# Patient Record
Sex: Male | Born: 1980 | Race: White | Hispanic: No | Marital: Married | Smoking: Never smoker
Health system: Southern US, Community
[De-identification: ages and names within clinical notes are randomized; demographics above are authoritative.]

## PROBLEM LIST (undated history)

## (undated) DIAGNOSIS — F419 Anxiety disorder, unspecified: Secondary | ICD-10-CM

## (undated) DIAGNOSIS — F329 Major depressive disorder, single episode, unspecified: Secondary | ICD-10-CM

## (undated) DIAGNOSIS — F32A Depression, unspecified: Secondary | ICD-10-CM

## (undated) HISTORY — DX: Major depressive disorder, single episode, unspecified: F32.9

## (undated) HISTORY — DX: Depression, unspecified: F32.A

## (undated) HISTORY — DX: Anxiety disorder, unspecified: F41.9

## (undated) HISTORY — PX: WISDOM TOOTH EXTRACTION: SHX21

---

## 2014-08-09 ENCOUNTER — Ambulatory Visit (INDEPENDENT_AMBULATORY_CARE_PROVIDER_SITE_OTHER): Payer: BC Managed Care – PPO | Admitting: Family Medicine

## 2014-08-09 VITALS — BP 104/70 | HR 95 | Temp 99.3°F | Resp 18 | Ht 73.0 in | Wt 217.8 lb

## 2014-08-09 DIAGNOSIS — A084 Viral intestinal infection, unspecified: Secondary | ICD-10-CM | POA: Diagnosis not present

## 2014-08-09 DIAGNOSIS — R519 Headache, unspecified: Secondary | ICD-10-CM

## 2014-08-09 DIAGNOSIS — R110 Nausea: Secondary | ICD-10-CM

## 2014-08-09 DIAGNOSIS — E860 Dehydration: Secondary | ICD-10-CM

## 2014-08-09 DIAGNOSIS — R197 Diarrhea, unspecified: Secondary | ICD-10-CM

## 2014-08-09 DIAGNOSIS — R51 Headache: Secondary | ICD-10-CM

## 2014-08-09 LAB — POCT CBC
Granulocyte percent: 80.5 %G — AB (ref 37–80)
HCT, POC: 46.6 % (ref 43.5–53.7)
Hemoglobin: 15.6 g/dL (ref 14.1–18.1)
Lymph, poc: 0.8 (ref 0.6–3.4)
MCH, POC: 28.1 pg (ref 27–31.2)
MCHC: 33.4 g/dL (ref 31.8–35.4)
MCV: 84.1 fL (ref 80–97)
MID (cbc): 0.7 (ref 0–0.9)
MPV: 7.6 fL (ref 0–99.8)
POC Granulocyte: 6.3 (ref 2–6.9)
POC LYMPH PERCENT: 10 %L (ref 10–50)
POC MID %: 9.5 % (ref 0–12)
Platelet Count, POC: 280 10*3/uL (ref 142–424)
RBC: 5.54 M/uL (ref 4.69–6.13)
RDW, POC: 13.4 %
WBC: 7.8 10*3/uL (ref 4.6–10.2)

## 2014-08-09 LAB — GLUCOSE, POCT (MANUAL RESULT ENTRY): POC Glucose: 103 mg/dL — AB (ref 70–99)

## 2014-08-09 LAB — POCT INFLUENZA A/B
Influenza A, POC: NEGATIVE
Influenza B, POC: NEGATIVE

## 2014-08-09 MED ORDER — ONDANSETRON 4 MG PO TBDP: 4.0000 mg | ORAL_TABLET | Freq: Three times a day (TID) | ORAL | Status: DC | PRN

## 2014-08-09 MED ORDER — KETOROLAC TROMETHAMINE 60 MG/2ML IM SOLN
60.0000 mg | Freq: Once | INTRAMUSCULAR | Status: AC
  Administered 2014-08-09: 60 mg via INTRAMUSCULAR

## 2014-08-09 MED ORDER — ONDANSETRON 4 MG PO TBDP
4.0000 mg | ORAL_TABLET | Freq: Once | ORAL | Status: AC
  Administered 2014-08-09: 4 mg via ORAL

## 2014-08-09 NOTE — Progress Notes (Signed)
Chief Complaint:  Chief Complaint  Patient presents with  . Nausea    x24 hours  . Headache  . Dizziness  . Generalized Body Aches    HPI: Carl Price is a 34 y.o. male who is here for 24 hours of nausea, headaches, dizziness, headache, primarily around temporal lobe and frotal lobe, dizziness, generalized body aches, diarrhea, nonbloody. Denies abd pain.  No sick contacts with similar sxs at home or at work. He has had diarrhea, nonbloody x 2 episodes. When he eats everything goes through. Fevers and chills. Yesterday he had [employer]. He has eaten there before without problems. He had food at home. He had some polenta and artichokes. Egg, no undercooked meats or dairy that he is aware of . He has city water. No one at his house has it. DHe has no neck pain. Shivers in the chest. No abd pain. Has not brough up anything. He has has hsoem SOb but no CO. No recent travels.   He takes Zoloft regularly and has had no side effects from the medication. He has had no increase in Zoloft.   Past Medical History  Diagnosis Date  . Anxiety   . Depression    History reviewed. No pertinent past surgical history. History   Social History  . Marital Status: Single    Spouse Name: N/A  . Number of Children: N/A  . Years of Education: N/A   Social History Main Topics  . Smoking status: Never Smoker   . Smokeless tobacco: Not on file  . Alcohol Use: 0.0 oz/week    0 Standard drinks or equivalent per week  . Drug Use: No  . Sexual Activity: Not on file   Other Topics Concern  . None   Social History Narrative  . None   History reviewed. No pertinent family history. No Known Allergies Prior to Admission medications   Medication Sig Start Date End Date Taking? Authorizing Provider  sertraline (ZOLOFT) 100 MG tablet Take 150 mg by mouth daily.   Yes Historical Provider, MD     ROS: The patient denies fevers, night sweats, unintentional weight loss, chest pain,  palpitations, wheezing, dyspnea on exertion, nausea, vomiting, abdominal pain, dysuria, hematuria, melena, numbness, weakness, or tingling.   All other systems have been reviewed and were otherwise negative with the exception of those mentioned in the HPI and as above.    PHYSICAL EXAM: Filed Vitals:   08/09/14 1658  BP: 110/82  Pulse: 105  Temp: 99.3 F (37.4 C)  Resp: 18   Filed Vitals:   08/09/14 1658  Height: 6\' 1"  (1.854 m)  Weight: 217 lb 12.8 oz (98.793 kg)   Body mass index is 28.74 kg/(m^2).  General: Alert, no acute distress, tired appearing.  HEENT:  Normocephalic, atraumatic, oropharynx patent. EOMI, PERRLA, TM normal.  Dry oral mucosa Cardiovascular:  Regular rate and rhythm, no rubs murmurs or gallops.  No Carotid bruits, radial pulse intact. No pedal edema.  Respiratory: Clear to auscultation bilaterally.  No wheezes, rales, or rhonchi.  No cyanosis, no use of accessory musculature GI: No organomegaly, abdomen is soft and non-tender, positive bowel sounds.  No masses. Skin: No rashes. Neurologic: Facial musculature symmetric. Psychiatric: Patient is appropriate throughout our interaction. Lymphatic: No cervical lymphadenopathy Musculoskeletal: Gait intact. No myoclonus. 5/5 strength , sensation intact   LABS: Results for orders placed or performed in visit on 08/09/14  POCT CBC  Result Value Ref Range   WBC  7.8 4.6 - 10.2 K/uL   Lymph, poc 0.8 0.6 - 3.4   POC LYMPH PERCENT 10.0 10 - 50 %L   MID (cbc) 0.7 0 - 0.9   POC MID % 9.5 0 - 12 %M   POC Granulocyte 6.3 2 - 6.9   Granulocyte percent 80.5 (A) 37 - 80 %G   RBC 5.54 4.69 - 6.13 M/uL   Hemoglobin 15.6 14.1 - 18.1 g/dL   HCT, POC 40.946.6 81.143.5 - 53.7 %   MCV 84.1 80 - 97 fL   MCH, POC 28.1 27 - 31.2 pg   MCHC 33.4 31.8 - 35.4 g/dL   RDW, POC 91.413.4 %   Platelet Count, POC 280 142 - 424 K/uL   MPV 7.6 0 - 99.8 fL  POCT glucose (manual entry)  Result Value Ref Range   POC Glucose 103 (A) 70 - 99 mg/dl    POCT Influenza A/B  Result Value Ref Range   Influenza A, POC Negative    Influenza B, POC Negative      EKG/XRAY:   Primary [facility]   ASSESSMENT/PLAN: Encounter Diagnoses  Name Primary?  . Viral gastroenteritis Yes  . Dehydration   . Nausea without vomiting   . Diarrhea    IV fluids given 2 L, he felt better.  Zofran ODT Bland diet advance as tolerated Returns stool samples for lab when able Labs pending  Gross sideeffects, risk and benefits, and alternatives of medications d/w patient. Patient is aware that all medications have potential sideeffects and we are unable to predict every sideeffect or drug-drug interaction that may occur.  Hamilton CapriLE, Luxe Cuadros PHUONG, DO 08/09/2014 6:28 PM    08/11/14-he is doing much better. He has no symptoms currently. I discussed his labs with him. His potassium was high but his red blood cells were hemolyzed and so I told him about returning for repeat blood work but he was okay with just monitoring everything. Given precautions to return ASAP

## 2014-08-09 NOTE — Patient Instructions (Signed)

## 2014-08-10 LAB — COMPLETE METABOLIC PANEL WITH GFR
ALT: 27 U/L (ref 0–53)
BUN: 13 mg/dL (ref 6–23)
CO2: 20 mEq/L (ref 19–32)
Calcium: 8.7 mg/dL (ref 8.4–10.5)
Creat: 0.96 mg/dL (ref 0.50–1.35)
GFR, Est African American: >89 mL/min
GFR, Est Non African American: >89 mL/min
Glucose, Bld: 99 mg/dL (ref 70–99)
Total Bilirubin: 0.7 mg/dL (ref 0.2–1.2)

## 2014-08-10 LAB — COMPLETE METABOLIC PANEL WITHOUT GFR
AST: 36 U/L (ref 0–37)
Albumin: 4 g/dL (ref 3.5–5.2)
Alkaline Phosphatase: 65 U/L (ref 39–117)
Chloride: 101 meq/L (ref 96–112)
Potassium: 6.1 meq/L — ABNORMAL HIGH (ref 3.5–5.3)
Sodium: 135 meq/L (ref 135–145)
Total Protein: 6.8 g/dL (ref 6.0–8.3)

## 2014-08-11 LAB — OVA AND PARASITE EXAMINATION: OP: NONE SEEN

## 2014-08-14 LAB — STOOL CULTURE

## 2014-10-28 ENCOUNTER — Ambulatory Visit (INDEPENDENT_AMBULATORY_CARE_PROVIDER_SITE_OTHER): Payer: BC Managed Care – PPO | Admitting: Physician Assistant

## 2014-10-28 VITALS — BP 122/84 | HR 96 | Temp 98.5°F | Resp 16 | Ht 73.0 in | Wt 218.0 lb

## 2014-10-28 DIAGNOSIS — J020 Streptococcal pharyngitis: Secondary | ICD-10-CM | POA: Diagnosis not present

## 2014-10-28 DIAGNOSIS — F418 Other specified anxiety disorders: Secondary | ICD-10-CM | POA: Diagnosis not present

## 2014-10-28 DIAGNOSIS — J029 Acute pharyngitis, unspecified: Secondary | ICD-10-CM | POA: Diagnosis not present

## 2014-10-28 DIAGNOSIS — F419 Anxiety disorder, unspecified: Secondary | ICD-10-CM

## 2014-10-28 DIAGNOSIS — F329 Major depressive disorder, single episode, unspecified: Secondary | ICD-10-CM

## 2014-10-28 DIAGNOSIS — F32A Depression, unspecified: Secondary | ICD-10-CM

## 2014-10-28 LAB — POCT RAPID STREP A (OFFICE): RAPID STREP A SCREEN: POSITIVE — AB

## 2014-10-28 MED ORDER — SERTRALINE HCL 100 MG PO TABS: 150.0000 mg | ORAL_TABLET | Freq: Every day | ORAL | Status: DC

## 2014-10-28 MED ORDER — AMOXICILLIN 875 MG PO TABS: 875.0000 mg | ORAL_TABLET | Freq: Two times a day (BID) | ORAL | Status: AC

## 2014-10-28 NOTE — Patient Instructions (Addendum)
Take antibiotic twice a day for 10 days. If you are not feeling much better in 48-72 hours, call. Make appointment for a physical with your primary doctor. 3 months of zoloft sent to pharmacy.

## 2014-10-28 NOTE — Progress Notes (Signed)
[facility] [address], [place] [address] [id number]  Date:  10/28/2014   Name:  Carl Price   DOB:  1980-08-21   MRN:  [id number]  PCP:  No primary care provider on file.    Chief Complaint: Sore Throat and sinus drainage   History of Present Illness:  This is a 34 y.o. male with PMH depression and anxiety who is presenting with 2 days of sore throat.  Cough: mild SOB/wheezing: no Nasal congestion: Feels like mucus is draining down back of throat but can't get any up. Otalgia: some yesterday but none today Sore throat: Worse at night and in the morning and subsides during the day. Fever/chills: having chills and aches but no fevers. Fiance with similar illness but has resolved. Today toddler started having vomiting.  Aggravating/alleviating factors: taking mucinex, tylenol and chloraseptic spray - some help. History of asthma: no History of env allergies: no Tobacco use: no  Pt has been taking zoloft 150 mg for the past 5 years. This is prescribed to him by his PCP who he sees once a year. He is about to run out of his medication. He called his PCPs office yesterday but no call back yet. He is due for a yearly physical with him. Wondering if he can get a short supply of his zoloft until his appt.  Review of Systems:  Review of Systems See HPI  There are no active problems to display for this patient.   Prior to Admission medications   Medication Sig Start Date End Date Taking? Authorizing Provider  sertraline (ZOLOFT) 100 MG tablet Take 150 mg by mouth daily.   Yes Historical Provider, MD    No Known Allergies  History reviewed. No pertinent past surgical history.  History  Substance Use Topics  . Smoking status: Never Smoker   . Smokeless tobacco: Not on file  . Alcohol Use: 0.0 oz/week    0 Standard drinks or equivalent per week    History reviewed. No pertinent family history.  Medication list has been reviewed and  updated.  Physical Examination:  Physical Exam  Constitutional: He is oriented to person, place, and time. He appears well-developed and well-nourished. No distress.  HENT:  Head: Normocephalic and atraumatic.  Right Ear: Hearing, tympanic membrane, external ear and ear canal normal.  Left Ear: Hearing, tympanic membrane, external ear and ear canal normal.  Nose: Nose normal. Right sinus exhibits no maxillary sinus tenderness and no frontal sinus tenderness. Left sinus exhibits no maxillary sinus tenderness and no frontal sinus tenderness.  Mouth/Throat: Uvula is midline and mucous membranes are normal. Posterior oropharyngeal erythema (beefy red) present. No oropharyngeal exudate or posterior oropharyngeal edema.  Eyes: Conjunctivae and lids are normal. Right eye exhibits no discharge. Left eye exhibits no discharge. No scleral icterus.  Cardiovascular: Normal rate, regular rhythm, normal heart sounds and normal pulses.   No murmur heard. Pulmonary/Chest: Effort normal and breath sounds normal. No respiratory distress. He has no wheezes. He has no rhonchi. He has no rales.  Musculoskeletal: Normal range of motion.  Lymphadenopathy:       Head (right side): No submental, no submandibular and no tonsillar adenopathy present.       Head (left side): No submental, no submandibular and no tonsillar adenopathy present.    He has no cervical adenopathy.  Neurological: He is alert and oriented to person, place, and time.  Skin: Skin is warm, dry and intact. No lesion and no  rash noted.  Psychiatric: He has a normal mood and affect. His speech is normal and behavior is normal. Thought content normal.   BP 122/84 mmHg  Pulse 96  Temp(Src) 98.5 F (36.9 C) (Oral)  Resp 16  Ht 6\' 1"  (1.854 m)  Wt 218 lb (98.884 kg)  BMI 28.77 kg/m2  SpO2 98%  Results for orders placed or performed in visit on 10/28/14  POCT rapid strep A  Result Value Ref Range   Rapid Strep A Screen Positive (A) Negative    Assessment and Plan:  1. Streptococcal sore throat 2. Sore throat Rapid strep positive. Amoxicillin prescribed. Call if not significantly improved in 48-72 hours. - amoxicillin (AMOXIL) 875 MG tablet; Take 1 tablet (875 mg total) by mouth 2 (two) times daily.  Dispense: 20 tablet; Refill: 0 - POCT rapid strep A  3. Anxiety and depression zoloft refilled x 3 months until his follow up with PCP. - sertraline (ZOLOFT) 100 MG tablet; Take 1.5 tablets (150 mg total) by mouth daily.  Dispense: 30 tablet; Refill: 2   [place] [employer]. Dyke Brackett, MHS [facility]  10/28/2014

## 2015-02-14 ENCOUNTER — Other Ambulatory Visit: Payer: Self-pay | Admitting: Physician Assistant

## 2015-02-19 ENCOUNTER — Encounter: Payer: Self-pay | Admitting: Internal Medicine

## 2015-02-19 ENCOUNTER — Ambulatory Visit (INDEPENDENT_AMBULATORY_CARE_PROVIDER_SITE_OTHER): Payer: BC Managed Care – PPO | Admitting: Internal Medicine

## 2015-02-19 VITALS — BP 118/72 | HR 73 | Temp 97.9°F | Resp 16 | Wt 224.0 lb

## 2015-02-19 DIAGNOSIS — H9190 Unspecified hearing loss, unspecified ear: Secondary | ICD-10-CM | POA: Diagnosis not present

## 2015-02-19 DIAGNOSIS — F419 Anxiety disorder, unspecified: Secondary | ICD-10-CM | POA: Diagnosis not present

## 2015-02-19 DIAGNOSIS — Z23 Encounter for immunization: Secondary | ICD-10-CM

## 2015-02-19 DIAGNOSIS — F329 Major depressive disorder, single episode, unspecified: Secondary | ICD-10-CM | POA: Insufficient documentation

## 2015-02-19 DIAGNOSIS — Z0000 Encounter for general adult medical examination without abnormal findings: Secondary | ICD-10-CM | POA: Diagnosis not present

## 2015-02-19 DIAGNOSIS — F32A Depression, unspecified: Secondary | ICD-10-CM | POA: Insufficient documentation

## 2015-02-19 MED ORDER — SERTRALINE HCL 100 MG PO TABS: 150.0000 mg | ORAL_TABLET | Freq: Every day | ORAL | Status: DC

## 2015-02-19 NOTE — Progress Notes (Signed)
Pre visit review using our clinic review tool, if applicable. No additional management support is needed unless otherwise documented below in the visit note. 

## 2015-02-19 NOTE — Patient Instructions (Addendum)
  We have reviewed your prior records including labs and tests today.  Test(s) ordered today. Your results will be released to MyChart (or called to you) after review, usually within 72hours after test completion. If any changes need to be made, you will be notified at that same time.  All other Health Maintenance issues reviewed.   All recommended immunizations and age-appropriate screenings are up-to-date.  Flu vaccine administered today.   Medications reviewed and updated.  No changes recommended at this time.  Your prescription(s) have been submitted to your pharmacy. Please take as directed and contact our office if you believe you are having problem(s) with the medication(s).   Please schedule followup in 6 months      

## 2015-02-19 NOTE — Progress Notes (Signed)
Subjective:    Patient ID: Carl Price, male    DOB: 05-24-1980, 34 y.o.   MRN: [id number]  HPI He is here to establish with a new pcp.  He is here for a physical exam. His only concern is getting another prescription for his sertraline.  Anxiety:  He has been on zoloft for years.  He feels it does affect his libido and has caused some weight gain.  He has never been on any other medication. He finds the medication very helpful and is happy with his current dose. He denies any history or issues with depression.  He thinks his hearing is worse.  He does have a family history of hearing loss.   Medications and allergies reviewed with patient and updated if appropriate.  Patient Active Problem List   Diagnosis Date Noted  . Anxiety 02/19/2015    No current outpatient prescriptions on file prior to visit.   No current facility-administered medications on file prior to visit.    Past Medical History  Diagnosis Date  . Anxiety   . Depression     Past Surgical History  Procedure Laterality Date  . Wisdom tooth extraction      Social History   Social History  . Marital Status: Single    Spouse Name: N/A  . Number of Children: N/A  . Years of Education: N/A   Social History Main Topics  . Smoking status: Never Smoker   . Smokeless tobacco: Never Used  . Alcohol Use: 0.0 oz/week    0 Standard drinks or equivalent per week  . Drug Use: No  . Sexual Activity: Not Asked   Other Topics Concern  . None   Social History Narrative   Engaged      Exercise: walking irregularly          Review of Systems  Constitutional: Negative for fever, chills, appetite change and unexpected weight change.       Energy level on lower side  HENT: Positive for hearing loss. Negative for congestion, sinus pressure and sore throat.   Eyes: Negative for visual disturbance.  Respiratory: Negative for cough, shortness of breath and wheezing.   Cardiovascular: Negative for chest  pain, palpitations and leg swelling.  Gastrointestinal: Positive for constipation (mild). Negative for nausea, abdominal pain, diarrhea and blood in stool.       No GERD  Genitourinary: Negative for dysuria and hematuria.  Musculoskeletal: Positive for arthralgias (pain in  shoulder area - intermittent). Negative for myalgias and back pain.  Neurological: Negative for dizziness, weakness, light-headedness, numbness and headaches.  Psychiatric/Behavioral: Negative for decreased concentration.       Objective:   Filed Vitals:   02/19/15 1313  BP: 118/72  Pulse: 73  Temp: 97.9 F (36.6 C)  Resp: 16   Filed Weights   02/19/15 1313  Weight: 224 lb (101.606 kg)   Body mass index is 29.56 kg/(m^2).   Physical Exam  Constitutional: He appears well-developed and well-nourished. No distress.  HENT:  Head: Normocephalic and atraumatic.  Right Ear: External ear normal.  Left Ear: External ear normal.  Excessive cerumen bilateral ear canals, unable to visualize tympanic membranes  Eyes: Conjunctivae are normal.  Neck: Neck supple. No tracheal deviation present. No thyromegaly present.  Cardiovascular: Normal rate, regular rhythm, normal heart sounds and intact distal pulses.   No murmur heard. Pulmonary/Chest: Effort normal and breath sounds normal. No respiratory distress. He has no wheezes. He has no rales.  Abdominal:  Soft. He exhibits no distension. There is no tenderness.  Musculoskeletal: He exhibits no edema.  Lymphadenopathy:    He has no cervical adenopathy.  Skin: Skin is warm and dry.  Psychiatric: He has a normal mood and affect. His behavior is normal.          Assessment & Plan:   Physical exam: Screening blood work ordered He believes he has had a tetanus in the last 10 years Flu shot. Stressed regular exercise He plans on working on weight loss Discussed healthy diet Discussed appropriate alcohol intake No concern for substance abuse He denies skin  concerns-recommended checking his for changes Discussed importance of self scrotal exams  See problem list for further assessment and plan  Follow-up in 6 months, sooner if needed

## 2015-02-19 NOTE — Assessment & Plan Note (Signed)
Continue sertraline 150 mg daily Discussed switching to a different medication given his side effects from medication, but he prefers to stay on same medication. He will call me or return if he changes his mind Follow-up in 6 months, sooner if needed

## 2015-08-13 ENCOUNTER — Telehealth: Payer: Self-pay

## 2015-08-13 DIAGNOSIS — F32A Depression, unspecified: Secondary | ICD-10-CM

## 2015-08-13 DIAGNOSIS — F329 Major depressive disorder, single episode, unspecified: Secondary | ICD-10-CM

## 2015-08-13 DIAGNOSIS — F419 Anxiety disorder, unspecified: Principal | ICD-10-CM

## 2015-08-13 MED ORDER — SERTRALINE HCL 100 MG PO TABS: 150.0000 mg | ORAL_TABLET | Freq: Every day | ORAL | Status: DC

## 2015-08-13 NOTE — Telephone Encounter (Signed)
Pt called and rq rf for sertraline. 6 month follow up was needed according to the 01/2015 OV.   Follow up appt has now been scheduled. Erx for sertraline has been sent.

## 2015-08-15 ENCOUNTER — Other Ambulatory Visit: Payer: Self-pay | Admitting: Internal Medicine

## 2015-08-15 NOTE — Telephone Encounter (Signed)
Pt is requesting 90 day supply. Okay to fill?

## 2015-08-31 ENCOUNTER — Encounter: Payer: BC Managed Care – PPO | Admitting: Internal Medicine

## 2015-08-31 DIAGNOSIS — Z0289 Encounter for other administrative examinations: Secondary | ICD-10-CM

## 2015-08-31 NOTE — Patient Instructions (Signed)
  Test(s) ordered today. Your results will be released to MyChart (or called to you) after review, usually within 72hours after test completion. If any changes need to be made, you will be notified at that same time.  All other Health Maintenance issues reviewed.   All recommended immunizations and age-appropriate screenings are up-to-date or discussed.  No immunizations administered today.   Medications reviewed and updated.  Changes include  /  No changes recommended at this time.  Your prescription(s) have been submitted to your pharmacy. Please take as directed and contact our office if you believe you are having problem(s) with the medication(s).  A referral  Please followup in    

## 2015-08-31 NOTE — Progress Notes (Signed)
    Subjective:    Patient ID: Carl Price, male    DOB: [birth year], 35 y.o.   MRN: [id number]  HPI  Error    Medications and allergies reviewed with patient and updated if appropriate.  Patient Active Problem List   Diagnosis Date Noted  . Anxiety 02/19/2015    Current Outpatient Prescriptions on File Prior to Visit  Medication Sig Dispense Refill  . sertraline (ZOLOFT) 100 MG tablet TAKE 1 AND 1/2 TABLETS(150 MG) BY MOUTH DAILY 135 tablet 0   No current facility-administered medications on file prior to visit.    Past Medical History  Diagnosis Date  . Anxiety   . Depression     Past Surgical History  Procedure Laterality Date  . Wisdom tooth extraction      Social History   Social History  . Marital Status: Single    Spouse Name: N/A  . Number of Children: N/A  . Years of Education: N/A   Social History Main Topics  . Smoking status: Never Smoker   . Smokeless tobacco: Never Used  . Alcohol Use: 0.0 oz/week    0 Standard drinks or equivalent per week  . Drug Use: No  . Sexual Activity: Not on file   Other Topics Concern  . Not on file   Social History Narrative   Engaged      Exercise: walking irregularly          Family History  Problem Relation Age of Onset  . Diabetes Father   . Heart disease Father     Review of Systems     Objective:  There were no vitals filed for this visit. There were no vitals filed for this visit. There is no weight on file to calculate BMI.   Physical Exam          Assessment & Plan:    This encounter was created in error - please disregard.

## 2015-09-14 ENCOUNTER — Ambulatory Visit: Payer: BC Managed Care – PPO | Admitting: Internal Medicine

## 2015-09-19 ENCOUNTER — Ambulatory Visit (INDEPENDENT_AMBULATORY_CARE_PROVIDER_SITE_OTHER): Payer: BC Managed Care – PPO | Admitting: Internal Medicine

## 2015-09-19 ENCOUNTER — Encounter: Payer: Self-pay | Admitting: Internal Medicine

## 2015-09-19 VITALS — BP 114/74 | HR 79 | Temp 98.6°F | Resp 16 | Wt 221.0 lb

## 2015-09-19 DIAGNOSIS — N539 Unspecified male sexual dysfunction: Secondary | ICD-10-CM | POA: Diagnosis not present

## 2015-09-19 DIAGNOSIS — F419 Anxiety disorder, unspecified: Secondary | ICD-10-CM

## 2015-09-19 NOTE — Patient Instructions (Addendum)
  Decrease to sertraline to 100 mg daily and continue that dose for at least 2 weeks.  If you feel ok you can then decrease further to 50 mg daily.  Continue that dose for at least 2 weeks, maybe 4 weeks.  You can then discontinue or go down to 25 mg daily if you are having any symptoms.

## 2015-09-19 NOTE — Progress Notes (Addendum)
    Subjective:    Patient ID: Carl Price, male    DOB: April 05, 1980, 35 y.o.   MRN: [id number]  HPI He is here for follow up.  Anxiety: He is taking his medication daily as prescribed. He does have some side effects from the medication - sexual dysfunction. He feels his anxiety is well controlled, but because of the side effects he would like to try to come off.  He has been on the medication for a long time.   He does not feel anxious at this time and denies depression.  She denies panic attacks.    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 Will stop medication and if sexual dysfunction continues can evaluate further.  He feels this started when he started the medication.    Medications and allergies reviewed with patient and updated if appropriate.  Patient Active Problem List   Diagnosis Date Noted  . Anxiety 02/19/2015    Current Outpatient Prescriptions on File Prior to Visit  Medication Sig Dispense Refill  . sertraline (ZOLOFT) 100 MG tablet TAKE 1 AND 1/2 TABLETS(150 MG) BY MOUTH DAILY 135 tablet 0   No current facility-administered medications on file prior to visit.    Past Medical History  Diagnosis Date  . Anxiety   . Depression     Past Surgical History  Procedure Laterality Date  . Wisdom tooth extraction      Social History   Social History  . Marital Status: Single    Spouse Name: N/A  . Number of Children: N/A  . Years of Education: N/A   Social History Main Topics  . Smoking status: Never Smoker   . Smokeless tobacco: Never Used  . Alcohol Use: 0.0 oz/week    0 Standard drinks or equivalent per week  . Drug Use: No  . Sexual Activity: Not on file   Other Topics Concern  . Not on file   Social History Narrative   Engaged      Exercise: walking irregularly          Family History  Problem Relation Age of Onset  . Diabetes Father   . Heart disease Father     Review of Systems  Respiratory: Negative for shortness of  breath.   Cardiovascular: Negative for chest pain and palpitations.  Psychiatric/Behavioral: Negative for dysphoric mood. The patient is not nervous/anxious.        Objective:   Filed Vitals:   09/19/15 1353  BP: 114/74  Pulse: 79  Temp: 98.6 F (37 C)  Resp: 16   Filed Weights   09/19/15 1353  Weight: 221 lb (100.245 kg)   Body mass index is 29.16 kg/(m^2).   Physical Exam  Constitutional: He appears well-developed and well-nourished. No distress.  Genitourinary:  deferred  Skin: He is not diaphoretic.  Psychiatric: He has a normal mood and affect. Judgment and thought content normal.          Assessment & Plan:   See Problem List for Assessment and Plan of chronic medical problems.

## 2015-09-19 NOTE — Progress Notes (Signed)
Pre visit review using our clinic review tool, if applicable. No additional management support is needed unless otherwise documented below in the visit note. 

## 2015-09-19 NOTE — Assessment & Plan Note (Addendum)
Discussed tapering off medication slowly - can adjust taper depending on if he has symptoms If no anxiety will discontinue medication and follow up only as needed If his anxiety returns can restart sertraline of consider different medication - effexor, buspar - he will follow up with me Discussed natural ways of helping stress, such as exercise

## 2015-09-20 DIAGNOSIS — N539 Unspecified male sexual dysfunction: Secondary | ICD-10-CM | POA: Insufficient documentation

## 2015-09-20 NOTE — Assessment & Plan Note (Signed)
Likely related to sertraline - started when he started that medication Sertraline will be stopped - if sexual dysfunction persists will evaluate further

## 2015-09-30 ENCOUNTER — Encounter: Payer: Self-pay | Admitting: Internal Medicine

## 2015-09-30 DIAGNOSIS — Z113 Encounter for screening for infections with a predominantly sexual mode of transmission: Secondary | ICD-10-CM

## 2015-10-01 NOTE — Telephone Encounter (Signed)
Received MyChart Message from pt, but MyChart shows inactive.Carl Price.  Hi Dr. Lawerance BachBurns!   I would like to have a full STD screening. I am not experiencing symptoms. However, I would still like to do this for peace of mine. Can I come in sometime to have the blood work done? Can I just go to the blood lab at any time? Do I need to fast?   [name]   Please advise

## 2015-10-01 NOTE — Telephone Encounter (Signed)
Tests ordered -- both blood and urine if he wants complete testing.

## 2015-10-03 NOTE — Telephone Encounter (Signed)
Spoke with pt to inform. MyChart reactivated.

## 2015-11-20 ENCOUNTER — Other Ambulatory Visit: Payer: Self-pay | Admitting: *Deleted

## 2015-11-20 MED ORDER — SERTRALINE HCL 100 MG PO TABS: ORAL_TABLET | ORAL | 0 refills | Status: DC

## 2015-12-01 ENCOUNTER — Other Ambulatory Visit: Payer: Self-pay | Admitting: Internal Medicine

## 2016-02-14 ENCOUNTER — Other Ambulatory Visit: Payer: Self-pay | Admitting: *Deleted

## 2016-02-14 MED ORDER — SERTRALINE HCL 100 MG PO TABS: ORAL_TABLET | ORAL | 1 refills | Status: DC

## 2016-02-14 NOTE — Telephone Encounter (Signed)
Rec'd call pt states he is needing refill on his Sertraline. Inform pt he is due for his yearly cpx. Made appt for 12/27, inform will send enough med to last until appt...Raechel Chute/lmb

## 2016-02-19 ENCOUNTER — Ambulatory Visit (INDEPENDENT_AMBULATORY_CARE_PROVIDER_SITE_OTHER): Payer: BC Managed Care – PPO

## 2016-02-19 DIAGNOSIS — Z23 Encounter for immunization: Secondary | ICD-10-CM | POA: Diagnosis not present

## 2016-03-26 ENCOUNTER — Other Ambulatory Visit (INDEPENDENT_AMBULATORY_CARE_PROVIDER_SITE_OTHER): Payer: BC Managed Care – PPO

## 2016-03-26 ENCOUNTER — Encounter: Payer: Self-pay | Admitting: Internal Medicine

## 2016-03-26 ENCOUNTER — Ambulatory Visit (INDEPENDENT_AMBULATORY_CARE_PROVIDER_SITE_OTHER): Payer: BC Managed Care – PPO | Admitting: Internal Medicine

## 2016-03-26 VITALS — BP 114/84 | HR 68 | Temp 98.4°F | Resp 16 | Wt 222.0 lb

## 2016-03-26 DIAGNOSIS — Z0000 Encounter for general adult medical examination without abnormal findings: Secondary | ICD-10-CM

## 2016-03-26 DIAGNOSIS — R7989 Other specified abnormal findings of blood chemistry: Secondary | ICD-10-CM | POA: Diagnosis not present

## 2016-03-26 DIAGNOSIS — F419 Anxiety disorder, unspecified: Secondary | ICD-10-CM | POA: Diagnosis not present

## 2016-03-26 DIAGNOSIS — Z23 Encounter for immunization: Secondary | ICD-10-CM

## 2016-03-26 LAB — COMPREHENSIVE METABOLIC PANEL
ALT: 18 U/L (ref 0–53)
AST: 18 U/L (ref 0–37)
Albumin: 4.5 g/dL (ref 3.5–5.2)
Alkaline Phosphatase: 75 U/L (ref 39–117)
BILIRUBIN TOTAL: 0.5 mg/dL (ref 0.2–1.2)
BUN: 14 mg/dL (ref 6–23)
CO2: 30 mEq/L (ref 19–32)
CREATININE: 1 mg/dL (ref 0.40–1.50)
Calcium: 9.3 mg/dL (ref 8.4–10.5)
Chloride: 101 mEq/L (ref 96–112)
GFR: 91.05 mL/min (ref >60.00)
GLUCOSE: 89 mg/dL (ref 70–99)
Potassium: 3.9 mEq/L (ref 3.5–5.1)
Sodium: 139 mEq/L (ref 135–145)
Total Protein: 7.3 g/dL (ref 6.0–8.3)

## 2016-03-26 LAB — CBC WITH DIFFERENTIAL/PLATELET
BASOS ABS: 0.1 10*3/uL (ref 0.0–0.1)
Basophils Relative: 0.8 % (ref 0.0–3.0)
EOS ABS: 0.2 10*3/uL (ref 0.0–0.7)
Eosinophils Relative: 1.8 % (ref 0.0–5.0)
HCT: 45.4 % (ref 39.0–52.0)
Hemoglobin: 15.8 g/dL (ref 13.0–17.0)
LYMPHS ABS: 3.3 10*3/uL (ref 0.7–4.0)
Lymphocytes Relative: 28.1 % (ref 12.0–46.0)
MCHC: 34.7 g/dL (ref 30.0–36.0)
MCV: 83.1 fl (ref 78.0–100.0)
MONO ABS: 0.7 10*3/uL (ref 0.1–1.0)
Monocytes Relative: 6.2 % (ref 3.0–12.0)
NEUTROS ABS: 7.3 10*3/uL (ref 1.4–7.7)
NEUTROS PCT: 63.1 % (ref 43.0–77.0)
PLATELETS: 334 10*3/uL (ref 150.0–400.0)
RBC: 5.47 Mil/uL (ref 4.22–5.81)
RDW: 13.8 % (ref 11.5–15.5)
WBC: 11.6 10*3/uL — ABNORMAL HIGH (ref 4.0–10.5)

## 2016-03-26 LAB — TSH: TSH: 1.82 u[IU]/mL (ref 0.35–4.50)

## 2016-03-26 LAB — LIPID PANEL
CHOLESTEROL: 205 mg/dL — AB (ref 0–200)
HDL: 34.4 mg/dL — ABNORMAL LOW (ref >39.00)
NonHDL: 170.48
Total CHOL/HDL Ratio: 6
Triglycerides: 225 mg/dL — ABNORMAL HIGH (ref 0.0–149.0)
VLDL: 45 mg/dL — AB (ref 0.0–40.0)

## 2016-03-26 LAB — LDL CHOLESTEROL, DIRECT: Direct LDL: 138 mg/dL

## 2016-03-26 LAB — HEMOGLOBIN A1C: HEMOGLOBIN A1C: 5.5 % (ref 4.6–6.5)

## 2016-03-26 MED ORDER — SERTRALINE HCL 100 MG PO TABS: ORAL_TABLET | ORAL | 1 refills | Status: DC

## 2016-03-26 NOTE — Patient Instructions (Addendum)
Test(s) ordered today. Your results will be released to MyChart (or called to you) after review, usually within 72hours after test completion. If any changes need to be made, you will be notified at that same time.  All other Health Maintenance issues reviewed.   All recommended immunizations and age-appropriate screenings are up-to-date or discussed.  Tdap immunization administered today.   Medications reviewed and updated.  No changes recommended at this time.    Please followup in 6 months for anxiety   Health Maintenance, Male A healthy lifestyle and preventative care can promote health and wellness.  Maintain regular health, dental, and eye exams.  Eat a healthy diet. Foods like vegetables, fruits, whole grains, low-fat dairy products, and lean protein foods contain the nutrients you need and are low in calories. Decrease your intake of foods high in solid fats, added sugars, and salt. Get information about a proper diet from your health care provider, if necessary.  Regular physical exercise is one of the most important things you can do for your health. Most adults should get at least 150 minutes of moderate-intensity exercise (any activity that increases your heart rate and causes you to sweat) each week. In addition, most adults need muscle-strengthening exercises on 2 or more days a week.   Maintain a healthy weight. The body mass index (BMI) is a screening tool to identify possible weight problems. It provides an estimate of body fat based on height and weight. Your health care provider can find your BMI and can help you achieve or maintain a healthy weight. For males 20 years and older:  A BMI below 18.5 is considered underweight.  A BMI of 18.5 to 24.9 is normal.  A BMI of 25 to 29.9 is considered overweight.  A BMI of 30 and above is considered obese.  Maintain normal blood lipids and cholesterol by exercising and minimizing your intake of saturated fat. Eat a  balanced diet with plenty of fruits and vegetables. Blood tests for lipids and cholesterol should begin at age 33 and be repeated every 5 years. If your lipid or cholesterol levels are high, you are over age 950, or you are at high risk for heart disease, you may need your cholesterol levels checked more frequently.Ongoing high lipid and cholesterol levels should be treated with medicines if diet and exercise are not working.  If you smoke, find out from your health care provider how to quit. If you do not use tobacco, do not start.  Lung cancer screening is recommended for adults aged 55-80 years who are at high risk for developing lung cancer because of a history of smoking. A yearly low-dose CT scan of the lungs is recommended for people who have at least a 30-pack-year history of smoking and are current smokers or have quit within the past 15 years. A pack year of smoking is smoking an average of 1 pack of cigarettes a day for 1 year (for example, a 30-pack-year history of smoking could mean smoking 1 pack a day for 30 years or 2 packs a day for 15 years). Yearly screening should continue until the smoker has stopped smoking for at least 15 years. Yearly screening should be stopped for people who develop a health problem that would prevent them from having lung cancer treatment.  If you choose to drink alcohol, do not have more than 2 drinks per day. One drink is considered to be 12 oz (360 mL) of beer, 5 oz (150 mL) of wine, or  1.5 oz (45 mL) of liquor.  Avoid the use of street drugs. Do not share needles with anyone. Ask for help if you need support or instructions about stopping the use of drugs.  High blood pressure causes heart disease and increases the risk of stroke. High blood pressure is more likely to develop in:  People who have blood pressure in the end of the normal range (100-139/85-89 mm Hg).  People who are overweight or obese.  People who are African American.  If you are [id number]  years of age, have your blood pressure checked every 3-5 years. If you are 33 years of age or older, have your blood pressure checked every year. You should have your blood pressure measured twice-once when you are at a hospital or clinic, and once when you are not at a hospital or clinic. Record the average of the two measurements. To check your blood pressure when you are not at a hospital or clinic, you can use:  An automated blood pressure machine at a pharmacy.  A home blood pressure monitor.  If you are 245-33 years old, ask your health care provider if you should take aspirin to prevent heart disease.  Diabetes screening involves taking a blood sample to check your fasting blood sugar level. This should be done once every 3 years after age 33 if you are at a normal weight and without risk factors for diabetes. Testing should be considered at a younger age or be carried out more frequently if you are overweight and have at least 1 risk factor for diabetes.  Colorectal cancer can be detected and often prevented. Most routine colorectal cancer screening begins at the age of 33 and continues through age 33. However, your health care provider may recommend screening at an earlier age if you have risk factors for colon cancer. On a yearly basis, your health care provider may provide home test kits to check for hidden blood in the stool. A small camera at the end of a tube may be used to directly examine the colon (sigmoidoscopy or colonoscopy) to detect the earliest forms of colorectal cancer. Talk to your health care provider about this at age 33 when routine screening begins. A direct exam of the colon should be repeated every 5-10 years through age 33, unless early forms of precancerous polyps or small growths are found.  People who are at an increased risk for hepatitis B should be screened for this virus. You are considered at high risk for hepatitis B if:  You were born in a country where  hepatitis B occurs often. Talk with your health care provider about which countries are considered high risk.  Your parents were born in a high-risk country and you have not received a shot to protect against hepatitis B (hepatitis B vaccine).  You have HIV or AIDS.  You use needles to inject street drugs.  You live with, or have sex with, someone who has hepatitis B.  You are a man who has sex with other men (MSM).  You get hemodialysis treatment.  You take certain medicines for conditions like cancer, organ transplantation, and autoimmune conditions.  Hepatitis C blood testing is recommended for all people born from [id number] through 1963 and any individual with known risk factors for hepatitis C.  Healthy men should no longer receive prostate-specific antigen (PSA) blood tests as part of routine cancer screening. Talk to your health care provider about prostate cancer screening.  Testicular cancer screening is not  recommended for adolescents or adult males who have no symptoms. Screening includes self-exam, a health care provider exam, and other screening tests. Consult with your health care provider about any symptoms you have or any concerns you have about testicular cancer.  Practice safe sex. Use condoms and avoid high-risk sexual practices to reduce the spread of sexually transmitted infections (STIs).  You should be screened for STIs, including gonorrhea and chlamydia if:  You are sexually active and are younger than 24 years.  You are older than 24 years, and your health care provider tells you that you are at risk for this type of infection.  Your sexual activity has changed since you were last screened, and you are at an increased risk for chlamydia or gonorrhea. Ask your health care provider if you are at risk.  If you are at risk of being infected with HIV, it is recommended that you take a prescription medicine daily to prevent HIV infection. This is called pre-exposure  prophylaxis (PrEP). You are considered at risk if:  You are a man who has sex with other men (MSM).  You are a heterosexual man who is sexually active with multiple partners.  You take drugs by injection.  You are sexually active with a partner who has HIV.  Talk with your health care provider about whether you are at high risk of being infected with HIV. If you choose to begin PrEP, you should first be tested for HIV. You should then be tested every 3 months for as long as you are taking PrEP.  Use sunscreen. Apply sunscreen liberally and repeatedly throughout the day. You should seek shade when your shadow is shorter than you. Protect yourself by wearing long sleeves, pants, a wide-brimmed hat, and sunglasses year round whenever you are outdoors.  Tell your health care provider of new moles or changes in moles, especially if there is a change in shape or color. Also, tell your health care provider if a mole is larger than the size of a pencil eraser.  A one-time screening for abdominal aortic aneurysm (AAA) and surgical repair of large AAAs by ultrasound is recommended for men aged 65-75 years who are current or former smokers.  Stay current with your vaccines (immunizations). This information is not intended to replace advice given to you by your health care provider. Make sure you discuss any questions you have with your health care provider. Document Released: 09/13/2007 Document Revised: 04/07/2014 Document Reviewed: 12/19/2014 Elsevier Interactive Patient Education  2017 [employer]

## 2016-03-26 NOTE — Progress Notes (Signed)
Pre visit review using our clinic review tool, if applicable. No additional management support is needed unless otherwise documented below in the visit note. 

## 2016-03-26 NOTE — Addendum Note (Signed)
Addended by: Zenovia JordanMITCHELL, TAYLOR B on: 03/26/2016 04:47 PM   Modules accepted: Orders

## 2016-03-26 NOTE — Assessment & Plan Note (Signed)
Not ideally controlled  Will work on changing how much he is working/job Will work on trying to exercise regularly Continue sertraline 150 mg daily Can increase sertraline to 200 mg daily if needed or try buspar  -- he will think about these options and let me know if he wants to try one.

## 2016-03-26 NOTE — Progress Notes (Signed)
Subjective:    Patient ID: Marcelle SmilingGrant D Mccrone, male    DOB: 1980-05-27, 35 y.o.   MRN: [id number]  HPI He is here for a physical exam.   Anxiety: He is taking his medication daily as prescribed. He denies any side effects from the medication. He feels his anxiety and stress are not well controlled.  He works full time and teaches 4 classes.  He is a step dad and has his own child on the way.  He is seeing a therapist.  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  He will try to get back to regular exercise.    Medications and allergies reviewed with patient and updated if appropriate.  Patient Active Problem List   Diagnosis Date Noted  . 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 Anxiety 02/19/2015    Current Outpatient Prescriptions on File Prior to Visit  Medication Sig Dispense Refill  . sertraline (ZOLOFT) 100 MG tablet TAKE 1 AND 1/2 TABLETS(150 MG) BY MOUTH DAILY 45 tablet 1   No current facility-administered medications on file prior to visit.     Past Medical History:  Diagnosis Date  . Anxiety   . Depression     Past Surgical History:  Procedure Laterality Date  . WISDOM TOOTH EXTRACTION      Social History   Social History  . Marital status: Single    Spouse name: N/A  . Number of children: N/A  . Years of education: N/A   Social History Main Topics  . Smoking status: Never Smoker  . Smokeless tobacco: Never Used  . Alcohol use 0.0 oz/week  . Drug use: No  . Sexual activity: Not on file   Other Topics Concern  . Not on file   Social History Narrative   Engaged      Exercise: walking irregularly          Family History  Problem Relation Age of Onset  . Diabetes Father   . Heart disease Father     Review of Systems  Constitutional: Negative for appetite change, chills, fatigue, fever and unexpected weight change.  Eyes: Negative for visual  disturbance.  Respiratory: Positive for shortness of breath (mild, related to not exercising). Negative for cough and wheezing.   Cardiovascular: Negative for chest pain, palpitations and leg swelling.  Gastrointestinal: Negative for abdominal pain, blood in stool, constipation, diarrhea and nausea.       No gerd  Genitourinary: Negative for dysuria and hematuria.  Musculoskeletal: Negative for arthralgias and back pain.  Skin: Negative for color change and rash.  Neurological: Negative for dizziness, light-headedness and headaches.  Psychiatric/Behavioral: Positive for dysphoric mood (mild). The patient is nervous/anxious.        Objective:   Vitals:   03/26/16 1301  BP: 114/84  Pulse: 68  Resp: 16  Temp: 98.4 F (36.9 C)   Filed Weights   03/26/16 1301  Weight: 222 lb (100.7 kg)   Body mass index is 29.29 kg/m.   Physical Exam Constitutional: He appears well-developed and well-nourished. No distress.  HENT:  Head: Normocephalic and atraumatic.  Right Ear: External ear normal.  Left Ear: External ear normal.  Mouth/Throat: Oropharynx is clear and moist.  Normal ear canals and TM b/l  Eyes: Conjunctivae and EOM are normal.  Neck: Neck supple. No tracheal deviation present. No thyromegaly present.  No carotid bruit  Cardiovascular: Normal rate, regular rhythm, normal heart sounds and intact distal pulses.   No murmur heard. Pulmonary/Chest: Effort normal and breath sounds normal. No respiratory distress. He has no wheezes. He has no rales.  Abdominal: Soft. Bowel sounds are normal. He exhibits no distension. There is no tenderness.  Genitourinary: deferred  Musculoskeletal: He exhibits no edema.  Lymphadenopathy:    He has no cervical adenopathy.  Skin: Skin is warm and dry. He is not diaphoretic.  Psychiatric: He has a normal mood and affect. His behavior is normal.         Assessment & Plan:   Physical exam: Screening blood work   ordered Immunizations -  tdap - today Exercise - irregular, but will try to walk more regularly Weight - will work on weight loss Skin no concerns Discussed importance of self testicular exams Substance abuse  - none  See Problem List for Assessment and Plan of chronic medical problems.   F/u in 6 months for anxiety

## 2016-03-31 ENCOUNTER — Encounter: Payer: Self-pay | Admitting: Internal Medicine

## 2016-05-09 ENCOUNTER — Encounter: Payer: Self-pay | Admitting: Nurse Practitioner

## 2016-05-09 ENCOUNTER — Ambulatory Visit (INDEPENDENT_AMBULATORY_CARE_PROVIDER_SITE_OTHER): Payer: BC Managed Care – PPO | Admitting: Nurse Practitioner

## 2016-05-09 VITALS — BP 128/82 | HR 74 | Temp 98.0°F | Ht 73.0 in | Wt 220.0 lb

## 2016-05-09 DIAGNOSIS — J209 Acute bronchitis, unspecified: Secondary | ICD-10-CM | POA: Diagnosis not present

## 2016-05-09 DIAGNOSIS — J0140 Acute pansinusitis, unspecified: Secondary | ICD-10-CM

## 2016-05-09 MED ORDER — HYDROCODONE-HOMATROPINE 5-1.5 MG/5ML PO SYRP: 5.0000 mL | ORAL_SOLUTION | Freq: Every evening | ORAL | 0 refills | Status: DC | PRN

## 2016-05-09 MED ORDER — METHYLPREDNISOLONE ACETATE 40 MG/ML IJ SUSP
40.0000 mg | Freq: Once | INTRAMUSCULAR | Status: AC
  Administered 2016-05-09: 40 mg via INTRAMUSCULAR

## 2016-05-09 MED ORDER — DM-GUAIFENESIN ER 30-600 MG PO TB12: 1.0000 | ORAL_TABLET | Freq: Two times a day (BID) | ORAL | 0 refills | Status: DC | PRN

## 2016-05-09 MED ORDER — AZITHROMYCIN 250 MG PO TABS: 250.0000 mg | ORAL_TABLET | Freq: Every day | ORAL | 0 refills | Status: DC

## 2016-05-09 MED ORDER — FLUTICASONE PROPIONATE 50 MCG/ACT NA SUSP: 2.0000 | Freq: Every day | NASAL | 0 refills | Status: DC

## 2016-05-09 MED ORDER — OXYMETAZOLINE HCL 0.05 % NA SOLN: 1.0000 | Freq: Two times a day (BID) | NASAL | 0 refills | Status: DC

## 2016-05-09 NOTE — Patient Instructions (Addendum)
URI Instructions: Flonase and Afrin use: apply 1spray of afrin in each nare, wait 5mins, then apply 2sprays of flonase in each nare. Use both nasal spray consecutively x 3days, then flonase only for at least 14days.  Encourage adequate oral hydration.  Use over-the-counter  "cold" medicines  such as "Tylenol cold" , "Advil cold",  "Mucinex" or" Mucinex D"  for cough and congestion.  Avoid decongestants if you have high blood pressure. Use" Delsym" or" Robitussin" cough syrup varietis for cough.  You can use plain "Tylenol" or "Advi"l for fever, chills and achyness.   "Common cold" symptoms are usually triggered by a virus.  The antibiotics are usually not necessary. On average, a" viral cold" illness would take 4-7 days to resolve. Please, make an appointment if you are not better or if you're worse.   Start azithromycin if no improvement in 3days. 

## 2016-05-09 NOTE — Progress Notes (Signed)
Pre visit review using our clinic review tool, if applicable. No additional management support is needed unless otherwise documented below in the visit note. 

## 2016-05-09 NOTE — Progress Notes (Signed)
Subjective:  Patient ID: Carl Price, male    DOB: 02-09-1981  Age: 36 y.o. MRN: [id number]  CC: Acute Visit (cold symptoms been going on all week, has taken otc sudafed, mucinex)   Sinusitis  This is a new problem. The current episode started in the past 7 days. The problem is unchanged. There has been no fever. Associated symptoms include chills, congestion, coughing, ear pain, headaches, a hoarse voice, sinus pressure and a sore throat. Pertinent negatives include no diaphoresis, neck pain, shortness of breath, sneezing or swollen glands. Past treatments include oral decongestants and acetaminophen. The treatment provided no relief.    Outpatient Medications Prior to Visit  Medication Sig Dispense Refill  . sertraline (ZOLOFT) 100 MG tablet TAKE 1 AND 1/2 TABLETS(150 MG) BY MOUTH DAILY 135 tablet 1   No facility-administered medications prior to visit.     ROS See HPI  Objective:  BP 128/82 (BP Location: Left Arm, Patient Position: Sitting, Cuff Size: Normal)   Pulse 74   Temp 98 F (36.7 C) (Oral)   Ht 6\' 1"  (1.854 m)   Wt 220 lb (99.8 kg)   SpO2 98%   BMI 29.03 kg/m   BP Readings from Last 3 Encounters:  05/09/16 128/82  03/26/16 114/84  09/19/15 114/74    Wt Readings from Last 3 Encounters:  05/09/16 220 lb (99.8 kg)  03/26/16 222 lb (100.7 kg)  09/19/15 221 lb (100.2 kg)    Physical Exam  Constitutional: He is oriented to person, place, and time. No distress.  HENT:  Right Ear: Tympanic membrane, external ear and ear canal normal.  Left Ear: Tympanic membrane and ear canal normal.  Nose: Mucosal edema and rhinorrhea present. Right sinus exhibits maxillary sinus tenderness and frontal sinus tenderness. Left sinus exhibits maxillary sinus tenderness and frontal sinus tenderness.  Mouth/Throat: Uvula is midline. Posterior oropharyngeal erythema present. No oropharyngeal exudate.  Eyes: No scleral icterus.  Neck: Normal range of motion. Neck supple.    Cardiovascular: Normal rate and regular rhythm.   Pulmonary/Chest: Effort normal and breath sounds normal.  Musculoskeletal: He exhibits no edema.  Neurological: He is alert and oriented to person, place, and time.  Vitals reviewed.   Lab Results  Component Value Date   WBC 11.6 (H) 03/26/2016   HGB 15.8 03/26/2016   HCT 45.4 03/26/2016   PLT 334.0 03/26/2016   GLUCOSE 89 03/26/2016   CHOL 205 (H) 03/26/2016   TRIG 225.0 (H) 03/26/2016   HDL 34.40 (L) 03/26/2016   LDLDIRECT 138.0 03/26/2016   ALT 18 03/26/2016   AST 18 03/26/2016   NA 139 03/26/2016   K 3.9 03/26/2016   CL 101 03/26/2016   CREATININE 1.00 03/26/2016   BUN 14 03/26/2016   CO2 30 03/26/2016   TSH 1.82 03/26/2016   HGBA1C 5.5 03/26/2016    Patient was never admitted.  Assessment & Plan:   Carl Price was seen today for acute visit.  Diagnoses and all orders for this visit:  Acute non-recurrent pansinusitis -     fluticasone (FLONASE) 50 MCG/ACT nasal spray; Place 2 sprays into both nostrils daily. -     oxymetazoline (AFRIN NASAL SPRAY) 0.05 % nasal spray; Place 1 spray into both nostrils 2 (two) times daily. Use only for 3days, then stop -     methylPREDNISolone acetate (DEPO-MEDROL) injection 40 mg; Inject 1 mL (40 mg total) into the muscle once. -     dextromethorphan-guaiFENesin (MUCINEX DM) 30-600 MG 12hr tablet; Take 1 tablet  by mouth 2 (two) times daily as needed for cough. -     HYDROcodone-homatropine (HYCODAN) 5-1.5 MG/5ML syrup; Take 5 mLs by mouth at bedtime as needed for cough. -     azithromycin (ZITHROMAX Z-PAK) 250 MG tablet; Take 1 tablet (250 mg total) by mouth daily. Take 2tabs on first day, then 1tab once a day till complete  Acute bronchitis, unspecified organism -     fluticasone (FLONASE) 50 MCG/ACT nasal spray; Place 2 sprays into both nostrils daily. -     oxymetazoline (AFRIN NASAL SPRAY) 0.05 % nasal spray; Place 1 spray into both nostrils 2 (two) times daily. Use only for 3days,  then stop -     methylPREDNISolone acetate (DEPO-MEDROL) injection 40 mg; Inject 1 mL (40 mg total) into the muscle once. -     dextromethorphan-guaiFENesin (MUCINEX DM) 30-600 MG 12hr tablet; Take 1 tablet by mouth 2 (two) times daily as needed for cough. -     HYDROcodone-homatropine (HYCODAN) 5-1.5 MG/5ML syrup; Take 5 mLs by mouth at bedtime as needed for cough. -     azithromycin (ZITHROMAX Z-PAK) 250 MG tablet; Take 1 tablet (250 mg total) by mouth daily. Take 2tabs on first day, then 1tab once a day till complete   I am having Carl Price start on fluticasone, oxymetazoline, dextromethorphan-guaiFENesin, HYDROcodone-homatropine, and azithromycin. I am also having him maintain his sertraline. We will continue to administer methylPREDNISolone acetate.  Meds ordered this encounter  Medications  . fluticasone (FLONASE) 50 MCG/ACT nasal spray    Sig: Place 2 sprays into both nostrils daily.    Dispense:  16 g    Refill:  0    Order Specific Question:   Supervising Provider    Answer:   Tresa GarterPLOTNIKOV, ALEKSEI V [[id number]]  . oxymetazoline (AFRIN NASAL SPRAY) 0.05 % nasal spray    Sig: Place 1 spray into both nostrils 2 (two) times daily. Use only for 3days, then stop    Dispense:  30 mL    Refill:  0    Order Specific Question:   Supervising Provider    Answer:   Tresa GarterPLOTNIKOV, ALEKSEI V [[id number]]  . methylPREDNISolone acetate (DEPO-MEDROL) injection 40 mg  . dextromethorphan-guaiFENesin (MUCINEX DM) 30-600 MG 12hr tablet    Sig: Take 1 tablet by mouth 2 (two) times daily as needed for cough.    Dispense:  14 tablet    Refill:  0    Order Specific Question:   Supervising Provider    Answer:   Tresa GarterPLOTNIKOV, ALEKSEI V [[id number]]  . HYDROcodone-homatropine (HYCODAN) 5-1.5 MG/5ML syrup    Sig: Take 5 mLs by mouth at bedtime as needed for cough.    Dispense:  120 mL    Refill:  0    Order Specific Question:   Supervising Provider    Answer:   Tresa GarterPLOTNIKOV, ALEKSEI V [[id number]]  . azithromycin (ZITHROMAX Z-PAK)  250 MG tablet    Sig: Take 1 tablet (250 mg total) by mouth daily. Take 2tabs on first day, then 1tab once a day till complete    Dispense:  6 tablet    Refill:  0    Order Specific Question:   Supervising Provider    Answer:   Tresa GarterPLOTNIKOV, ALEKSEI V [[id number]]    Follow-up: Return if symptoms worsen or fail to improve.  Alysia Pennaharlotte Barkley [place], NP

## 2016-06-04 ENCOUNTER — Encounter: Payer: Self-pay | Admitting: Internal Medicine

## 2016-06-04 MED ORDER — SERTRALINE HCL 100 MG PO TABS: ORAL_TABLET | ORAL | 5 refills | Status: DC

## 2016-09-23 NOTE — Progress Notes (Deleted)
Subjective:    Patient ID: Carl SmilingGrant D Price, male    DOB: [birth year], 36 y.o.   MRN: [id number]  HPI The patient is here for follow up.  Anxiety: He is taking his medication daily as prescribed. He denies any side effects from the medication. He feels his anxiety is well controlled and he is happy with his current dose of medication.    Medications and allergies reviewed with patient and updated if appropriate.  Patient Active Problem List   Diagnosis Date Noted  . 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 Anxiety 02/19/2015    Current Outpatient Prescriptions on File Prior to Visit  Medication Sig Dispense Refill  . azithromycin (ZITHROMAX Z-PAK) 250 MG tablet Take 1 tablet (250 mg total) by mouth daily. Take 2tabs on first day, then 1tab once a day till complete 6 tablet 0  . dextromethorphan-guaiFENesin (MUCINEX DM) 30-600 MG 12hr tablet Take 1 tablet by mouth 2 (two) times daily as needed for cough. 14 tablet 0  . fluticasone (FLONASE) 50 MCG/ACT nasal spray Place 2 sprays into both nostrils daily. 16 g 0  . HYDROcodone-homatropine (HYCODAN) 5-1.5 MG/5ML syrup Take 5 mLs by mouth at bedtime as needed for cough. 120 mL 0  . oxymetazoline (AFRIN NASAL SPRAY) 0.05 % nasal spray Place 1 spray into both nostrils 2 (two) times daily. Use only for 3days, then stop 30 mL 0  . sertraline (ZOLOFT) 100 MG tablet TAKE 1 AND 1/2 TABLETS(150 MG) BY MOUTH DAILY 135 tablet 5   No current facility-administered medications on file prior to visit.     Past Medical History:  Diagnosis Date  . Anxiety   . Depression     Past Surgical History:  Procedure Laterality Date  . WISDOM TOOTH EXTRACTION      Social History   Social History  . Marital status: Single    Spouse name: N/A  . Number of children: N/A  . Years of education: N/A   Social History Main Topics  . Smoking status: Never Smoker  . Smokeless tobacco: Never Used  . Alcohol use 0.0 oz/week     Comment: social  . Drug use:  No  . Sexual activity: Not on file   Other Topics Concern  . Not on file   Social History Narrative   Exercise: walking irregularly          Family History  Problem Relation Age of Onset  . Diabetes Father   . Heart disease Father        s/p heart transplant  . Kidney disease Father        related to transplant    Review of Systems     Objective:  There were no vitals filed for this visit. Wt Readings from Last 3 Encounters:  05/09/16 220 lb (99.8 kg)  03/26/16 222 lb (100.7 kg)  09/19/15 221 lb (100.2 kg)   There is no height or weight on file to calculate BMI.   Physical Exam    Constitutional: Appears well-developed and well-nourished. No distress.  HENT:  Head: Normocephalic and atraumatic.  Neck: Neck supple. No tracheal deviation present. No thyromegaly present.  No cervical lymphadenopathy Cardiovascular: Normal rate, regular rhythm and normal heart sounds.   No murmur heard. No carotid bruit .  No edema Pulmonary/Chest: Effort normal and breath sounds normal. No respiratory distress. No has no wheezes. No rales.  Skin: Skin is warm and dry. Not diaphoretic.  Psychiatric: Normal mood and affect. Behavior is  normal.      Assessment & Plan:    See Problem List for Assessment and Plan of chronic medical problems.

## 2016-09-24 ENCOUNTER — Ambulatory Visit: Payer: BC Managed Care – PPO | Admitting: Internal Medicine

## 2016-12-29 ENCOUNTER — Telehealth: Payer: Self-pay | Admitting: *Deleted

## 2016-12-29 ENCOUNTER — Telehealth: Payer: BC Managed Care – PPO | Admitting: Family

## 2016-12-29 DIAGNOSIS — H109 Unspecified conjunctivitis: Secondary | ICD-10-CM

## 2016-12-29 MED ORDER — POLYMYXIN B-TRIMETHOPRIM 10000-0.1 UNIT/ML-% OP SOLN: 1.0000 [drp] | OPHTHALMIC | 0 refills | Status: DC

## 2016-12-29 NOTE — Telephone Encounter (Signed)
Medication already sent by other provider

## 2016-12-29 NOTE — Telephone Encounter (Signed)
Left msg on triage stating there baby had pink-eye and now they have it want MD to call something in...Raechel Chute

## 2016-12-29 NOTE — Progress Notes (Signed)
Thank you for the details you included in the comment boxes. Those details are very helpful in determining the best course of treatment for you and help [place] to provide the best care. This is a common request to prescribe for 2 people and, of course,we don't mind. That being said, just like a face-to-face clinic, we would have to electronically prescribe it tied to a treatment plan in each person's chart. Please have her submit an e-visit so that we can send hers also. The bottle for treatment for one person is not enough to treat 2 people adequately. You must treat both eyes due to drainage transfer on pillows when sleeping.   We are sorry that you are not feeling well.  Here is how we plan to help!  Based on what you have shared with me it looks like you have conjunctivitis.  Conjunctivitis is a common inflammatory or infectious condition of the eye that is often referred to as "pink eye".  In most cases it is contagious (viral or bacterial). However, not all conjunctivitis requires antibiotics (ex. Allergic).  We have made appropriate suggestions for you based upon your presentation.  I have prescribed Polytrim Ophthalmic drops 1 drop in both eyes every 4 hours times 5 days  Pink eye can be highly contagious.  It is typically spread through direct contact with secretions, or contaminated objects or surfaces that one may have touched.  Strict handwashing is suggested with soap and water is urged.  If not available, use alcohol based had sanitizer.  Avoid unnecessary touching of the eye.  If you wear contact lenses, you will need to refrain from wearing them until you see no white discharge from the eye for at least 24 hours after being on medication.  You should see symptom improvement in 1-2 days after starting the medication regimen.  Call us if symptoms are not improved in 1-2 days.  Home Care:  Wash your hands often!  Do not wear your contacts until you complete your treatment plan.  Avoid  sharing towels, bed linen, personal items with a person who has pink eye.  See attention for anyone in your home with similar symptoms.  Get Help Right Away If:  Your symptoms do not improve.  You develop blurred or loss of vision.  Your symptoms worsen (increased discharge, pain or redness)  Your e-visit answers were reviewed by a board certified advanced clinical practitioner to complete your personal care plan.  Depending on the condition, your plan could have included both over the counter or prescription medications.  If there is a problem please reply  once you have received a response from your provider.  Your safety is important to [place].  If you have drug allergies check your prescription carefully.    You can use MyChart to ask questions about today's visit, request a non-urgent call back, or ask for a work or school excuse for 24 hours related to this e-Visit. If it has been greater than 24 hours you will need to follow up with your provider, or enter a new e-Visit to address those concerns.   You will get an e-mail in the next two days asking about your experience.  I hope that your e-visit has been valuable and will speed your recovery. Thank you for using e-visits.

## 2016-12-31 ENCOUNTER — Ambulatory Visit (INDEPENDENT_AMBULATORY_CARE_PROVIDER_SITE_OTHER): Payer: BC Managed Care – PPO | Admitting: Internal Medicine

## 2016-12-31 ENCOUNTER — Encounter: Payer: Self-pay | Admitting: Internal Medicine

## 2016-12-31 ENCOUNTER — Other Ambulatory Visit: Payer: BC Managed Care – PPO

## 2016-12-31 ENCOUNTER — Telehealth: Payer: Self-pay | Admitting: Internal Medicine

## 2016-12-31 VITALS — BP 122/70 | HR 73 | Temp 98.0°F | Resp 16 | Wt 223.0 lb

## 2016-12-31 DIAGNOSIS — J029 Acute pharyngitis, unspecified: Secondary | ICD-10-CM | POA: Diagnosis not present

## 2016-12-31 DIAGNOSIS — B349 Viral infection, unspecified: Secondary | ICD-10-CM

## 2016-12-31 LAB — POCT RAPID STREP A (OFFICE): RAPID STREP A SCREEN: NEGATIVE

## 2016-12-31 NOTE — Telephone Encounter (Signed)
[facility] - Client TELEPHONE ADVICE RECORD [facility]  Patient Name: Carl Price  DOB: 16-May-1980    Initial Comment Caller states he has had a very sore throat for the past four days. He also has pink eye. He has pain in his ears too.   Nurse Assessment  Nurse: Odis Luster, RN, Bjorn Loser Date/Time (Eastern Time): 12/31/2016 8:30:57 AM  Confirm and document reason for call. If symptomatic, describe symptoms. ---Caller states he has had a very sore throat for the past four days. He also has pink eye. He has pain in his ears too. Reports fever in the beginning of illness but no longer.  Does the patient have any new or worsening symptoms? ---Yes  Will a triage be completed? ---Yes  Related visit to physician within the last 2 weeks? ---No  Does the PT have any chronic conditions? (i.e. diabetes, asthma, etc.) ---No  Is the patient pregnant or possibly pregnant? (Ask all females between the ages of 72-55) ---No  Is this a behavioral health or substance abuse call? ---No     Guidelines    Guideline Title Affirmed Question Affirmed Notes       Final Disposition User        Comments  Appt scheduled in the [place] office with Dr. Lawerance Bach for today at 2:30pm

## 2016-12-31 NOTE — Assessment & Plan Note (Signed)
Rapid strep negative-we will send culture Advil 800 mg 3 times daily with food Keep up with fluids, rest Will call with culture results

## 2016-12-31 NOTE — Assessment & Plan Note (Signed)
His symptoms are likely viral in nature Will send throat culture to rule out strep Advil 800 mg 3 times daily with food Rest, fluids Over-the-counter cold medications or symptom relief Call if no improvement

## 2016-12-31 NOTE — Patient Instructions (Signed)
Take 800 mg of Advil three times a day with food. Use other cold medications for symptom relief.  Increase your rest and fluids.  We will call you with the results of your throat culture.       Sore Throat A sore throat is pain, burning, irritation, or scratchiness in the throat. When you have a sore throat, you may feel pain or tenderness in your throat when you swallow or talk. Many things can cause a sore throat, including:  An infection.  Seasonal allergies.  Dryness in the air.  Irritants, such as smoke or pollution.  Gastroesophageal reflux disease (GERD).  A tumor.  A sore throat is often the first sign of another sickness. It may happen with other symptoms, such as coughing, sneezing, fever, and swollen neck glands. Most sore throats go away without medical treatment. Follow these instructions at home:  Take over-the-counter medicines only as told by your health care provider.  Drink enough fluids to keep your urine clear or pale yellow.  Rest as needed.  To help with pain, try: ? Sipping warm liquids, such as broth, herbal tea, or warm water. ? Eating or drinking cold or frozen liquids, such as frozen ice pops. ? Gargling with a salt-water mixture 3-4 times a day or as needed. To make a salt-water mixture, completely dissolve -1 tsp of salt in 1 cup of warm water. ? Sucking on hard candy or throat lozenges. ? Putting a cool-mist humidifier in your bedroom at night to moisten the air. ? Sitting in the bathroom with the door closed for 5-10 minutes while you run hot water in the shower.  Do not use any tobacco products, such as cigarettes, chewing tobacco, and e-cigarettes. If you need help quitting, ask your health care provider. Contact a health care provider if:  You have a fever for more than 2-3 days.  You have symptoms that last (are persistent) for more than 2-3 days.  Your throat does not get better within 7 days.  You have a fever and your symptoms  suddenly get worse. Get help right away if:  You have difficulty breathing.  You cannot swallow fluids, soft foods, or your saliva.  You have increased swelling in your throat or neck.  You have persistent nausea and vomiting. This information is not intended to replace advice given to you by your health care provider. Make sure you discuss any questions you have with your health care provider. Document Released: 04/24/2004 Document Revised: 11/11/2015 Document Reviewed: 01/05/2015 Elsevier Interactive Patient Education  [employer].

## 2016-12-31 NOTE — Progress Notes (Signed)
Subjective:    Patient ID: Carl Price, male    DOB: [birth year], 36 y.o.   MRN: [id number]  HPI He is here for an acute visit.   His symptoms started about 5 days ago. He states chills when the symptoms first started. He is left ear pain, extremely sore throat, eye itching and redness, cough that is very mild, diarrhea which started today and body aches. He denies any known fever, nasal congestion, sinus pain, shortness of breath, wheezing and headaches. He did do an electronic visit for his eye symptoms and was started on antibacterial eyedrop 2 days ago and has been no improvement. He did start to have symptoms in the right eye-previously was only the left eye.  His 43 month old had a ear infection recently.    He is taking otc pain relievers (tylenol) and gargling with salt water.    Medications and allergies reviewed with patient and updated if appropriate.  Patient Active Problem List   Diagnosis Date Noted  . 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 Anxiety 02/19/2015    Current Outpatient Prescriptions on File Prior to Visit  Medication Sig Dispense Refill  . fluticasone (FLONASE) 50 MCG/ACT nasal spray Place 2 sprays into both nostrils daily. 16 g 0  . sertraline (ZOLOFT) 100 MG tablet TAKE 1 AND 1/2 TABLETS(150 MG) BY MOUTH DAILY 135 tablet 5  . trimethoprim-polymyxin b (POLYTRIM) ophthalmic solution Place 1 drop into both eyes every 4 (four) hours. 10 mL 0   No current facility-administered medications on file prior to visit.     Past Medical History:  Diagnosis Date  . Anxiety   . Depression     Past Surgical History:  Procedure Laterality Date  . WISDOM TOOTH EXTRACTION      Social History   Social History  . Marital status: Single    Spouse name: N/A  . Number of children: N/A  . Years of education: N/A   Social History Main Topics  . Smoking status: Never Smoker  . Smokeless tobacco: Never Used  . Alcohol use 0.0 oz/week     Comment: social  .  Drug use: No  . Sexual activity: Not Asked   Other Topics Concern  . None   Social History Narrative   Exercise: walking irregularly          Family History  Problem Relation Age of Onset  . Diabetes Father   . Heart disease Father        s/p heart transplant  . Kidney disease Father        related to transplant    Review of Systems  Constitutional: Positive for chills. Negative for fever.  HENT: Positive for ear pain and sore throat. Negative for congestion, sinus pain and sinus pressure.   Eyes: Positive for redness and itching.  Respiratory: Positive for cough (very little). Negative for shortness of breath and wheezing.   Cardiovascular: Negative for chest pain.  Gastrointestinal: Positive for diarrhea (started today). Negative for nausea.  Musculoskeletal: Positive for myalgias.  Neurological: Negative for dizziness, light-headedness and headaches.       Objective:   Vitals:   12/31/16 1441  BP: 122/70  Pulse: 73  Resp: 16  Temp: 98 F (36.7 C)  SpO2: 97%   Filed Weights   12/31/16 1441  Weight: 223 lb (101.2 kg)   Body mass index is 29.42 kg/m.  Wt Readings from Last 3 Encounters:  12/31/16 223 lb (101.2 kg)  05/09/16  220 lb (99.8 kg)  03/26/16 222 lb (100.7 kg)     Physical Exam GENERAL APPEARANCE: Appears stated age, well appearing, NAD EYES: conjunctiva clear, no icterus HEENT: bilateral tympanic membranes and ear canals normal, oropharynx with Moderate erythema, no thyromegaly, trachea midline, positive tender cervical no supraclavicular lymphadenopathy LUNGS: Clear to auscultation without wheeze or crackles, unlabored breathing, good air entry bilaterally HEART: Normal S1,S2 without murmurs EXTREMITIES: Without clubbing, cyanosis, or edema        Assessment & Plan:   See Problem List for Assessment and Plan of chronic medical problems.

## 2017-01-07 ENCOUNTER — Telehealth: Payer: Self-pay | Admitting: Emergency Medicine

## 2017-01-07 NOTE — Telephone Encounter (Signed)
LVM for pt to call back if an appt was needed for symptoms of pink eye.

## 2017-06-04 ENCOUNTER — Other Ambulatory Visit: Payer: Self-pay | Admitting: Internal Medicine

## 2017-06-30 ENCOUNTER — Other Ambulatory Visit: Payer: Self-pay | Admitting: Internal Medicine

## 2017-07-06 ENCOUNTER — Other Ambulatory Visit: Payer: Self-pay | Admitting: Internal Medicine

## 2017-07-06 MED ORDER — SERTRALINE HCL 100 MG PO TABS: ORAL_TABLET | ORAL | 0 refills | Status: DC

## 2017-08-11 NOTE — Progress Notes (Signed)
Subjective:    Patient ID: Carl Price, male    DOB: [birth year], 37 y.o.   MRN: [id number]  HPI He is here for a physical exam.     Medications and allergies reviewed with patient and updated if appropriate.  Patient Active Problem List   Diagnosis Date Noted  . Sore throat 12/31/2016  . Viral illness 12/31/2016  . 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 Anxiety 02/19/2015    Current Outpatient Medications on File Prior to Visit  Medication Sig Dispense Refill  . fluticasone (FLONASE) 50 MCG/ACT nasal spray Place 2 sprays into both nostrils daily. 16 g 0  . sertraline (ZOLOFT) 100 MG tablet TAKE 1 AND 1/2 TABLETS(150 MG) BY MOUTH DAILY 45 tablet 0  . trimethoprim-polymyxin b (POLYTRIM) ophthalmic solution Place 1 drop into both eyes every 4 (four) hours. 10 mL 0   No current facility-administered medications on file prior to visit.     Past Medical History:  Diagnosis Date  . Anxiety   . Depression     Past Surgical History:  Procedure Laterality Date  . WISDOM TOOTH EXTRACTION      Social History   Socioeconomic History  . Marital status: Single    Spouse name: Not on file  . Number of children: Not on file  . Years of education: Not on file  . Highest education level: Not on file  Occupational History  . Not on file  Social Needs  . Financial resource strain: Not on file  . Food insecurity:    Worry: Not on file    Inability: Not on file  . Transportation needs:    Medical: Not on file    Non-medical: Not on file  Tobacco Use  . Smoking status: Never Smoker  . Smokeless tobacco: Never Used  Substance and Sexual Activity  . Alcohol use: Yes    Alcohol/week: 0.0 oz    Comment: social  . Drug use: No  . Sexual activity: Not on file  Lifestyle  . Physical activity:    Days per week: Not on file    Minutes per session: Not on file  . Stress: Not on file  Relationships  . Social connections:    Talks on phone: Not on file    Gets together:  Not on file    Attends religious service: Not on file    Active member of club or organization: Not on file    Attends meetings of clubs or organizations: Not on file    Relationship status: Not on file  Other Topics Concern  . Not on file  Social History Narrative   Exercise: walking irregularly       Family History  Problem Relation Age of Onset  . Diabetes Father   . Heart disease Father        s/p heart transplant  . Kidney disease Father        related to transplant    Review of Systems     Objective:  There were no vitals filed for this visit. There were no vitals filed for this visit. There is no height or weight on file to calculate BMI.  Wt Readings from Last 3 Encounters:  12/31/16 223 lb (101.2 kg)  05/09/16 220 lb (99.8 kg)  03/26/16 222 lb (100.7 kg)     Physical Exam Constitutional: He appears well-developed and well-nourished. No distress.  HENT:  Head: Normocephalic and atraumatic.  Right Ear: External ear normal.  Left Ear:  External ear normal.  Mouth/Throat: Oropharynx is clear and moist.  Normal ear canals and TM b/l  Eyes: Conjunctivae and EOM are normal.  Neck: Neck supple. No tracheal deviation present. No thyromegaly present.  No carotid bruit  Cardiovascular: Normal rate, regular rhythm, normal heart sounds and intact distal pulses.   No murmur heard. Pulmonary/Chest: Effort normal and breath sounds normal. No respiratory distress. He has no wheezes. He has no rales.  Abdominal: Soft. He exhibits no distension. There is no tenderness.  Genitourinary: deferred  Musculoskeletal: He exhibits no edema.  Lymphadenopathy:   He has no cervical adenopathy.  Skin: Skin is warm and dry. He is not diaphoretic.  Psychiatric: He has a normal mood and affect. His behavior is normal.         Assessment & Plan:   Physical exam: Screening blood work ordered Immunizations  Up to date  Exercise Weight Skin  Substance abuse  See Problem List  for Assessment and Plan of chronic medical problems.     This encounter was created in error - please disregard.

## 2017-08-11 NOTE — Patient Instructions (Signed)
Test(s) ordered today. Your results will be released to MyChart (or called to you) after review, usually within 72hours after test completion. If any changes need to be made, you will be notified at that same time.  All other Health Maintenance issues reviewed.   All recommended immunizations and age-appropriate screenings are up-to-date or discussed.  No immunizations administered today.   Medications reviewed and updated.  Changes include  /  No changes recommended at this time.  Your prescription(s) have been submitted to your pharmacy. Please take as directed and contact our office if you believe you are having problem(s) with the medication(s).  A referral was ordered for   Please followup in one year    Health Maintenance, Male A healthy lifestyle and preventive care is important for your health and wellness. Ask your health care provider about what schedule of regular examinations is right for you. What should I know about weight and diet? Eat a Healthy Diet  Eat plenty of vegetables, fruits, whole grains, low-fat dairy products, and lean protein.  Do not eat a lot of foods high in solid fats, added sugars, or salt.  Maintain a Healthy Weight Regular exercise can help you achieve or maintain a healthy weight. You should:  Do at least 150 minutes of exercise each week. The exercise should increase your heart rate and make you sweat (moderate-intensity exercise).  Do strength-training exercises at least twice a week.  Watch Your Levels of Cholesterol and Blood Lipids  Have your blood tested for lipids and cholesterol every 5 years starting at 35 years of age. If you are at high risk for heart disease, you should start having your blood tested when you are 35 years old. You may need to have your cholesterol levels checked more often if: ? Your lipid or cholesterol levels are high. ? You are older than 35 years of age. ? You are at high risk for heart disease.  What should  I know about cancer screening? Many types of cancers can be detected early and may often be prevented. Lung Cancer  You should be screened every year for lung cancer if: ? You are a current smoker who has smoked for at least 30 years. ? You are a former smoker who has quit within the past 15 years.  Talk to your health care provider about your screening options, when you should start screening, and how often you should be screened.  Colorectal Cancer  Routine colorectal cancer screening usually begins at 35 years of age and should be repeated every 5-10 years until you are 35 years old. You may need to be screened more often if early forms of precancerous polyps or small growths are found. Your health care provider may recommend screening at an earlier age if you have risk factors for colon cancer.  Your health care provider may recommend using home test kits to check for hidden blood in the stool.  A small camera at the end of a tube can be used to examine your colon (sigmoidoscopy or colonoscopy). This checks for the earliest forms of colorectal cancer.  Prostate and Testicular Cancer  Depending on your age and overall health, your health care provider may do certain tests to screen for prostate and testicular cancer.  Talk to your health care provider about any symptoms or concerns you have about testicular or prostate cancer.  Skin Cancer  Check your skin from head to toe regularly.  Tell your health care provider about any new  moles or changes in moles, especially if: ? There is a change in a mole's size, shape, or color. ? You have a mole that is larger than a pencil eraser.  Always use sunscreen. Apply sunscreen liberally and repeat throughout the day.  Protect yourself by wearing long sleeves, pants, a wide-brimmed hat, and sunglasses when outside.  What should I know about heart disease, diabetes, and high blood pressure?  If you are 28-55 years of age, have your blood  pressure checked every 3-5 years. If you are 54 years of age or older, have your blood pressure checked every year. You should have your blood pressure measured twice-once when you are at a hospital or clinic, and once when you are not at a hospital or clinic. Record the average of the two measurements. To check your blood pressure when you are not at a hospital or clinic, you can use: ? An automated blood pressure machine at a pharmacy. ? A home blood pressure monitor.  Talk to your health care provider about your target blood pressure.  If you are between 34-70 years old, ask your health care provider if you should take aspirin to prevent heart disease.  Have regular diabetes screenings by checking your fasting blood sugar level. ? If you are at a normal weight and have a low risk for diabetes, have this test once every three years after the age of 45. ? If you are overweight and have a high risk for diabetes, consider being tested at a younger age or more often.  A one-time screening for abdominal aortic aneurysm (AAA) by ultrasound is recommended for men aged 66-75 years who are current or former smokers. What should I know about preventing infection? Hepatitis B If you have a higher risk for hepatitis B, you should be screened for this virus. Talk with your health care provider to find out if you are at risk for hepatitis B infection. Hepatitis C Blood testing is recommended for:  Everyone born from [birth year] through 1965.  Anyone with known risk factors for hepatitis C.  Sexually Transmitted Diseases (STDs)  You should be screened each year for STDs including gonorrhea and chlamydia if: ? You are sexually active and are younger than 35 years of age. ? You are older than 35 years of age and your health care provider tells you that you are at risk for this type of infection. ? Your sexual activity has changed since you were last screened and you are at an increased risk for chlamydia or  gonorrhea. Ask your health care provider if you are at risk.  Talk with your health care provider about whether you are at high risk of being infected with HIV. Your health care provider may recommend a prescription medicine to help prevent HIV infection.  What else can I do?  Schedule regular health, dental, and eye exams.  Stay current with your vaccines (immunizations).  Do not use any tobacco products, such as cigarettes, chewing tobacco, and e-cigarettes. If you need help quitting, ask your health care provider.  Limit alcohol intake to no more than 2 drinks per day. One drink equals 12 ounces of beer, 5 ounces of wine, or 1 ounces of hard liquor.  Do not use street drugs.  Do not share needles.  Ask your health care provider for help if you need support or information about quitting drugs.  Tell your health care provider if you often feel depressed.  Tell your health care provider if you  have ever been abused or do not feel safe at home. This information is not intended to replace advice given to you by your health care provider. Make sure you discuss any questions you have with your health care provider. Document Released: 09/13/2007 Document Revised: 11/14/2015 Document Reviewed: 12/19/2014 Elsevier Interactive Patient Education  [employer].

## 2017-08-12 ENCOUNTER — Telehealth: Payer: Self-pay | Admitting: Internal Medicine

## 2017-08-12 MED ORDER — SERTRALINE HCL 100 MG PO TABS: ORAL_TABLET | ORAL | 0 refills | Status: DC

## 2017-08-12 NOTE — Telephone Encounter (Signed)
Copied from CRM [id number]. Topic: Quick Communication - Rx Refill/Question >> Aug 12, 2017  8:27 AM Diana Eves B wrote: Medication: sertraline (ZOLOFT) 100 MG tablet  Pt has cpe scheduled for 09/28/17  Has the patient contacted their pharmacy? Yes.   (Agent: If no, request that the patient contact the pharmacy for the refill.) Preferred Pharmacy (with phone number or street name): [id number] - [place], [place] - [address] AT [facility] OF [facility] [address] Agent: Please be advised that RX refills may take up to 3 business days. We ask that you follow-up with your pharmacy.

## 2017-08-12 NOTE — Telephone Encounter (Signed)
Per office policy sent 30 day to local pharmacy until appt.../lmb  

## 2017-08-13 ENCOUNTER — Encounter: Payer: BC Managed Care – PPO | Admitting: Internal Medicine

## 2017-09-04 ENCOUNTER — Other Ambulatory Visit: Payer: Self-pay | Admitting: Internal Medicine

## 2017-09-04 MED ORDER — SERTRALINE HCL 100 MG PO TABS: ORAL_TABLET | ORAL | 5 refills | Status: DC

## 2017-09-04 NOTE — Telephone Encounter (Signed)
Reviewed chart pt is up-to-date sent refills to [facility].../lmb  

## 2017-09-04 NOTE — Telephone Encounter (Signed)
Please advise regarding refill request

## 2017-09-27 NOTE — Progress Notes (Signed)
Subjective:    Patient ID: Carl Price, male    DOB: November 15, 1980, 37 y.o.   MRN: [id number]  HPI He is here for a physical exam.   His father died in the past year.    There is increased stress due to moves, job changes for him and his wife.  He has one child and two step-children.  There are legal issues regarding his stepchildren in and out of court.  He also is trying to advance his career and that is causing extra stress.  He feels medication is controlling his anxiety.  He denies any depression.  He does see a therapist.   Medications and allergies reviewed with patient and updated if appropriate.  Patient Active Problem List   Diagnosis Date Noted  . Family history of diabetes mellitus in father 09/28/2017  . 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 Anxiety 02/19/2015    Current Outpatient Medications on File Prior to Visit  Medication Sig Dispense Refill  . fluticasone (FLONASE) 50 MCG/ACT nasal spray Place 2 sprays into both nostrils daily. 16 g 0  . sertraline (ZOLOFT) 100 MG tablet TAKE 1 AND 1/2 TABLETS(150 MG) BY MOUTH DAILY 45 tablet 5   No current facility-administered medications on file prior to visit.     Past Medical History:  Diagnosis Date  . Anxiety   . Depression     Past Surgical History:  Procedure Laterality Date  . WISDOM TOOTH EXTRACTION      Social History   Socioeconomic History  . Marital status: Married    Spouse name: Not on file  . Number of children: Not on file  . Years of education: Not on file  . Highest education level: Not on file  Occupational History  . Not on file  Social Needs  . Financial resource strain: Not on file  . Food insecurity:    Worry: Not on file    Inability: Not on file  . Transportation needs:    Medical: Not on file    Non-medical: Not on file  Tobacco Use  . Smoking status: Never Smoker  . Smokeless tobacco: Never Used  Substance and Sexual Activity  . Alcohol use: Yes    Alcohol/week: 0.0  oz    Comment: social  . Drug use: No  . Sexual activity: Not on file  Lifestyle  . Physical activity:    Days per week: Not on file    Minutes per session: Not on file  . Stress: Not on file  Relationships  . Social connections:    Talks on phone: Not on file    Gets together: Not on file    Attends religious service: Not on file    Active member of club or organization: Not on file    Attends meetings of clubs or organizations: Not on file    Relationship status: Not on file  Other Topics Concern  . Not on file  Social History Narrative   Exercise: walking irregularly       Family History  Problem Relation Age of Onset  . Diabetes Father   . Heart disease Father        s/p heart transplant  . Kidney disease Father        related to transplant    Review of Systems  Constitutional: Negative for chills and fever.  Eyes: Negative for visual disturbance.  Respiratory: Positive for shortness of breath (related to deconditioning). Negative for cough and wheezing.  Cardiovascular: Negative for chest pain, palpitations and leg swelling.  Gastrointestinal: Negative for abdominal pain, blood in stool, constipation, diarrhea and nausea.       No gerd  Genitourinary: Positive for difficulty urinating (mild delay in urination). Negative for dysuria and hematuria.  Musculoskeletal: Positive for back pain (lower back -intermittent). Negative for arthralgias.  Skin: Negative for color change and rash.  Neurological: Negative for light-headedness and headaches.  Psychiatric/Behavioral: Negative for dysphoric mood. The patient is nervous/anxious (controlled).        Objective:   Vitals:   09/28/17 1344  BP: 108/70  Pulse: 74  Resp: 16  Temp: 98.5 F (36.9 C)  SpO2: 97%   Filed Weights   09/28/17 1344  Weight: 222 lb (100.7 kg)   Body mass index is 29.29 kg/m.  Wt Readings from Last 3 Encounters:  09/28/17 222 lb (100.7 kg)  12/31/16 223 lb (101.2 kg)  05/09/16 220  lb (99.8 kg)     Physical Exam Constitutional: He appears well-developed and well-nourished. No distress.  HENT:  Head: Normocephalic and atraumatic.  Right Ear: External ear normal.  Left Ear: External ear normal.  Mouth/Throat: Oropharynx is clear and moist.  Normal ear canals and TM b/l  Eyes: Conjunctivae and EOM are normal.  Neck: Neck supple. No tracheal deviation present. No thyromegaly present.  No carotid bruit  Cardiovascular: Normal rate, regular rhythm, normal heart sounds and intact distal pulses.   No murmur heard. Pulmonary/Chest: Effort normal and breath sounds normal. No respiratory distress. He has no wheezes. He has no rales.  Abdominal: Soft. He exhibits no distension. There is no tenderness.  Genitourinary: deferred  Musculoskeletal: He exhibits no edema.  Lymphadenopathy:   He has no cervical adenopathy.  Skin: Skin is warm and dry. He is not diaphoretic.  Psychiatric: He has a normal mood and affect. His behavior is normal.         Assessment & Plan:   Physical exam: Screening blood work   ordered Immunizations   Up to date  Exercise   None Weight  Will work on weight loss Skin no concerns Substance abuse   none  See Problem List for Assessment and Plan of chronic medical problems.   FU in one year

## 2017-09-27 NOTE — Patient Instructions (Addendum)
Test(s) ordered today. Your results will be released to MyChart (or called to you) after review, usually within 72hours after test completion. If any changes need to be made, you will be notified at that same time.  All other Health Maintenance issues reviewed.   All recommended immunizations and age-appropriate screenings are up-to-date or discussed.  No immunizations administered today.   Medications reviewed and updated.  No changes recommended at this time.  Your prescription(s) have been submitted to your pharmacy. Please take as directed and contact our office if you believe you are having problem(s) with the medication(s).  Please followup in one year    Health Maintenance, Male A healthy lifestyle and preventive care is important for your health and wellness. Ask your health care provider about what schedule of regular examinations is right for you. What should I know about weight and diet? Eat a Healthy Diet  Eat plenty of vegetables, fruits, whole grains, low-fat dairy products, and lean protein.  Do not eat a lot of foods high in solid fats, added sugars, or salt.  Maintain a Healthy Weight Regular exercise can help you achieve or maintain a healthy weight. You should:  Do at least 150 minutes of exercise each week. The exercise should increase your heart rate and make you sweat (moderate-intensity exercise).  Do strength-training exercises at least twice a week.  Watch Your Levels of Cholesterol and Blood Lipids  Have your blood tested for lipids and cholesterol every 5 years starting at 35 years of age. If you are at high risk for heart disease, you should start having your blood tested when you are 35 years old. You may need to have your cholesterol levels checked more often if: ? Your lipid or cholesterol levels are high. ? You are older than 35 years of age. ? You are at high risk for heart disease.  What should I know about cancer screening? Many types of  cancers can be detected early and may often be prevented. Lung Cancer  You should be screened every year for lung cancer if: ? You are a current smoker who has smoked for at least 30 years. ? You are a former smoker who has quit within the past 15 years.  Talk to your health care provider about your screening options, when you should start screening, and how often you should be screened.  Colorectal Cancer  Routine colorectal cancer screening usually begins at 35 years of age and should be repeated every 5-10 years until you are 35 years old. You may need to be screened more often if early forms of precancerous polyps or small growths are found. Your health care provider may recommend screening at an earlier age if you have risk factors for colon cancer.  Your health care provider may recommend using home test kits to check for hidden blood in the stool.  A small camera at the end of a tube can be used to examine your colon (sigmoidoscopy or colonoscopy). This checks for the earliest forms of colorectal cancer.  Prostate and Testicular Cancer  Depending on your age and overall health, your health care provider may do certain tests to screen for prostate and testicular cancer.  Talk to your health care provider about any symptoms or concerns you have about testicular or prostate cancer.  Skin Cancer  Check your skin from head to toe regularly.  Tell your health care provider about any new moles or changes in moles, especially if: ? There is a change   in a mole's size, shape, or color. ? You have a mole that is larger than a pencil eraser.  Always use sunscreen. Apply sunscreen liberally and repeat throughout the day.  Protect yourself by wearing long sleeves, pants, a wide-brimmed hat, and sunglasses when outside.  What should I know about heart disease, diabetes, and high blood pressure?  If you are 18-39 years of age, have your blood pressure checked every 3-5 years. If you are  40 years of age or older, have your blood pressure checked every year. You should have your blood pressure measured twice-once when you are at a hospital or clinic, and once when you are not at a hospital or clinic. Record the average of the two measurements. To check your blood pressure when you are not at a hospital or clinic, you can use: ? An automated blood pressure machine at a pharmacy. ? A home blood pressure monitor.  Talk to your health care provider about your target blood pressure.  If you are between 45-79 years old, ask your health care provider if you should take aspirin to prevent heart disease.  Have regular diabetes screenings by checking your fasting blood sugar level. ? If you are at a normal weight and have a low risk for diabetes, have this test once every three years after the age of 45. ? If you are overweight and have a high risk for diabetes, consider being tested at a younger age or more often.  A one-time screening for abdominal aortic aneurysm (AAA) by ultrasound is recommended for men aged 65-75 years who are current or former smokers. What should I know about preventing infection? Hepatitis B If you have a higher risk for hepatitis B, you should be screened for this virus. Talk with your health care provider to find out if you are at risk for hepatitis B infection. Hepatitis C Blood testing is recommended for:  Everyone born from 1945 through 1965.  Anyone with known risk factors for hepatitis C.  Sexually Transmitted Diseases (STDs)  You should be screened each year for STDs including gonorrhea and chlamydia if: ? You are sexually active and are younger than 35 years of age. ? You are older than 35 years of age and your health care provider tells you that you are at risk for this type of infection. ? Your sexual activity has changed since you were last screened and you are at an increased risk for chlamydia or gonorrhea. Ask your health care provider if you  are at risk.  Talk with your health care provider about whether you are at high risk of being infected with HIV. Your health care provider may recommend a prescription medicine to help prevent HIV infection.  What else can I do?  Schedule regular health, dental, and eye exams.  Stay current with your vaccines (immunizations).  Do not use any tobacco products, such as cigarettes, chewing tobacco, and e-cigarettes. If you need help quitting, ask your health care provider.  Limit alcohol intake to no more than 2 drinks per day. One drink equals 12 ounces of beer, 5 ounces of wine, or 1 ounces of hard liquor.  Do not use street drugs.  Do not share needles.  Ask your health care provider for help if you need support or information about quitting drugs.  Tell your health care provider if you often feel depressed.  Tell your health care provider if you have ever been abused or do not feel safe at home. This   information is not intended to replace advice given to you by your health care provider. Make sure you discuss any questions you have with your health care provider. Document Released: 09/13/2007 Document Revised: 11/14/2015 Document Reviewed: 12/19/2014 Elsevier Interactive Patient Education  2018 [employer]  

## 2017-09-28 ENCOUNTER — Encounter: Payer: Self-pay | Admitting: Internal Medicine

## 2017-09-28 ENCOUNTER — Encounter

## 2017-09-28 ENCOUNTER — Ambulatory Visit (INDEPENDENT_AMBULATORY_CARE_PROVIDER_SITE_OTHER): Payer: BC Managed Care – PPO | Admitting: Internal Medicine

## 2017-09-28 VITALS — BP 108/70 | HR 74 | Temp 98.5°F | Resp 16 | Ht 73.0 in | Wt 222.0 lb

## 2017-09-28 DIAGNOSIS — Z0000 Encounter for general adult medical examination without abnormal findings: Secondary | ICD-10-CM

## 2017-09-28 DIAGNOSIS — F419 Anxiety disorder, unspecified: Secondary | ICD-10-CM

## 2017-09-28 DIAGNOSIS — E782 Mixed hyperlipidemia: Secondary | ICD-10-CM

## 2017-09-28 DIAGNOSIS — E785 Hyperlipidemia, unspecified: Secondary | ICD-10-CM | POA: Insufficient documentation

## 2017-09-28 DIAGNOSIS — Z833 Family history of diabetes mellitus: Secondary | ICD-10-CM

## 2017-09-28 MED ORDER — SERTRALINE HCL 100 MG PO TABS: ORAL_TABLET | ORAL | 1 refills | Status: DC

## 2017-09-28 NOTE — Assessment & Plan Note (Signed)
Check lipid panel, TSH, CMP We will try increasing exercise Work on weight loss Low-fat/cholesterol diet

## 2017-09-28 NOTE — Assessment & Plan Note (Signed)
Check a1c Low sugar / carb diet Stressed regular exercise, weight loss  

## 2017-09-28 NOTE — Assessment & Plan Note (Addendum)
Sees a therapist Overall controlled with medication-continue current dose Follow-up yearly, sooner if needed

## 2018-02-15 ENCOUNTER — Ambulatory Visit: Payer: BC Managed Care – PPO | Admitting: Family

## 2018-02-26 ENCOUNTER — Other Ambulatory Visit: Payer: Self-pay | Admitting: Internal Medicine

## 2018-02-26 MED ORDER — SERTRALINE HCL 100 MG PO TABS: ORAL_TABLET | ORAL | 0 refills | Status: DC

## 2018-02-26 NOTE — Telephone Encounter (Signed)
Copied from CRM [id number]. Topic: Quick Communication - Rx Refill/Question >> Feb 26, 2018  8:28 AM Maia Pettiesrtiz, Kristie S wrote: Medication: sertraline (ZOLOFT) 100 MG tablet - pt will be out today - advised pt likely not to be refilled until next week.  Has the patient contacted their pharmacy? No - pharmacy is not open  Preferred Pharmacy (with phone number or street name): [id number] [place] - [address] [facility] [address] [facility] [address] [phone] (Phone) [phone] (Fax)

## 2018-03-02 NOTE — Progress Notes (Signed)
Subjective:    Patient ID: Carl Price, male    DOB: 07/28/1980, 37 y.o.   MRN: [id number]  HPI He is here for an acute visit for cold symptoms.  His symptoms started two weeks ago.  He did go to urgent care and was prescribed a zpak and steroid.  They may have helped slightly.    He is experiencing fevers, chills, fatigue, nasal congestion, sinus pain, dry cough, shortness of breath, wheezing and headaches.  He denies any ear pain, sore throat, nausea and dizziness.  He has tried taking the above and was taking mucinex, otc cold meds, cough syrup and tylenol.    He does feel better today.    Medications and allergies reviewed with patient and updated if appropriate.  Patient Active Problem List   Diagnosis Date Noted  . Family history of diabetes mellitus in father 09/28/2017  . Hyperlipidemia 09/28/2017  . 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 Anxiety 02/19/2015    Current Outpatient Medications on File Prior to Visit  Medication Sig Dispense Refill  . fluticasone (FLONASE) 50 MCG/ACT nasal spray Place 2 sprays into both nostrils daily. 16 g 0  . sertraline (ZOLOFT) 100 MG tablet TAKE 1 AND 1/2 TABLETS(150 MG) BY MOUTH DAILY 135 tablet 0   No current facility-administered medications on file prior to visit.     Past Medical History:  Diagnosis Date  . Anxiety   . Depression     Past Surgical History:  Procedure Laterality Date  . WISDOM TOOTH EXTRACTION      Social History   Socioeconomic History  . Marital status: Married    Spouse name: Not on file  . Number of children: Not on file  . Years of education: Not on file  . Highest education level: Not on file  Occupational History  . Not on file  Social Needs  . Financial resource strain: Not on file  . Food insecurity:    Worry: Not on file    Inability: Not on file  . Transportation needs:    Medical: Not on file    Non-medical: Not on file  Tobacco Use  . Smoking status: Never Smoker  .  Smokeless tobacco: Never Used  Substance and Sexual Activity  . Alcohol use: Yes    Alcohol/week: 0.0 standard drinks    Comment: social  . Drug use: No  . Sexual activity: Not on file  Lifestyle  . Physical activity:    Days per week: Not on file    Minutes per session: Not on file  . Stress: Not on file  Relationships  . Social connections:    Talks on phone: Not on file    Gets together: Not on file    Attends religious service: Not on file    Active member of club or organization: Not on file    Attends meetings of clubs or organizations: Not on file    Relationship status: Not on file  Other Topics Concern  . Not on file  Social History Narrative   Exercise: walking irregularly       Family History  Problem Relation Age of Onset  . Diabetes Father   . Heart disease Father        s/p heart transplant  . Kidney disease Father        related to transplant  . Heart failure Maternal Aunt     Review of Systems  Constitutional: Positive for chills, fatigue and fever.  HENT: Positive for congestion and sinus pain. Negative for ear pain (some - none now) and sore throat.   Respiratory: Positive for cough (dry), shortness of breath and wheezing.   Gastrointestinal: Positive for vomiting (two nights ago). Negative for nausea.  Neurological: Positive for headaches. Negative for dizziness.       Objective:   Vitals:   03/03/18 1414  BP: 122/82  Pulse: 91  Resp: 16  Temp: 99.6 F (37.6 C)  SpO2: 96%   Filed Weights   03/03/18 1414  Weight: 220 lb 9.6 oz (100.1 kg)   Body mass index is 29.1 kg/m.  Wt Readings from Last 3 Encounters:  03/03/18 220 lb 9.6 oz (100.1 kg)  09/28/17 222 lb (100.7 kg)  12/31/16 223 lb (101.2 kg)     Physical Exam GENERAL APPEARANCE: Appears stated age, well appearing, NAD EYES: conjunctiva clear, no icterus HEENT: bilateral tympanic membranes and ear canals normal, oropharynx with mild erythema, no thyromegaly, trachea midline,  no cervical or supraclavicular lymphadenopathy LUNGS: Clear to auscultation without wheeze or crackles, unlabored breathing, good air entry bilaterally CARDIOVASCULAR: Normal S1,S2 without murmurs, no edema SKIN: warm, dry        Assessment & Plan:   See Problem List for Assessment and Plan of chronic medical problems.

## 2018-03-03 ENCOUNTER — Ambulatory Visit: Payer: BC Managed Care – PPO | Admitting: Internal Medicine

## 2018-03-03 ENCOUNTER — Encounter: Payer: Self-pay | Admitting: Internal Medicine

## 2018-03-03 VITALS — BP 122/82 | HR 91 | Temp 99.6°F | Resp 16 | Ht 73.0 in | Wt 220.6 lb

## 2018-03-03 DIAGNOSIS — J069 Acute upper respiratory infection, unspecified: Secondary | ICD-10-CM

## 2018-03-03 MED ORDER — HYDROCODONE-HOMATROPINE 5-1.5 MG/5ML PO SYRP: 5.0000 mL | ORAL_SOLUTION | Freq: Three times a day (TID) | ORAL | 0 refills | Status: DC | PRN

## 2018-03-03 NOTE — Patient Instructions (Signed)
  Medications reviewed and updated.  Changes include :   Cough syrup with codeine.   Your prescription(s) have been submitted to your pharmacy. Please take as directed and contact our office if you believe you are having problem(s) with the medication(s).    Call if no improvement

## 2018-03-03 NOTE — Assessment & Plan Note (Signed)
Completed a Z-Pak and steroids from urgent care Has been taking over-the-counter cold medications Symptoms waxing and waning and today he does feel slightly better Exam currently unremarkable ?  Residual cough and drainage from viral infection or already treated bacterial infection Continue symptomatic treatment, will prescribe Hycodan cough syrup Continue increase rest and fluids If if symptoms do not improve or worsen over the next couple of days we will consider another antibiotic

## 2018-05-22 ENCOUNTER — Other Ambulatory Visit: Payer: Self-pay | Admitting: Internal Medicine

## 2018-09-07 ENCOUNTER — Other Ambulatory Visit: Payer: Self-pay | Admitting: Internal Medicine

## 2018-11-08 NOTE — Patient Instructions (Addendum)
Tests ordered today. Your results will be released to MyChart (or called to you) after review.  If any changes need to be made, you will be notified at that same time.  All other Health Maintenance issues reviewed.   All recommended immunizations and age-appropriate screenings are up-to-date or discussed.  No immunization administered today.   Medications reviewed and updated.  Changes include :   none  Your prescription(s) have been submitted to your pharmacy. Please take as directed and contact our office if you believe you are having problem(s) with the medication(s).   Please followup in 1 year    Health Maintenance, Male Adopting a healthy lifestyle and getting preventive care are important in promoting health and wellness. Ask your health care provider about:  The right schedule for you to have regular tests and exams.  Things you can do on your own to prevent diseases and keep yourself healthy. What should I know about diet, weight, and exercise? Eat a healthy diet   Eat a diet that includes plenty of vegetables, fruits, low-fat dairy products, and lean protein.  Do not eat a lot of foods that are high in solid fats, added sugars, or sodium. Maintain a healthy weight Body mass index (BMI) is a measurement that can be used to identify possible weight problems. It estimates body fat based on height and weight. Your health care provider can help determine your BMI and help you achieve or maintain a healthy weight. Get regular exercise Get regular exercise. This is one of the most important things you can do for your health. Most adults should:  Exercise for at least 150 minutes each week. The exercise should increase your heart rate and make you sweat (moderate-intensity exercise).  Do strengthening exercises at least twice a week. This is in addition to the moderate-intensity exercise.  Spend less time sitting. Even light physical activity can be beneficial. Watch  cholesterol and blood lipids Have your blood tested for lipids and cholesterol at 36 years of age, then have this test every 5 years. You may need to have your cholesterol levels checked more often if:  Your lipid or cholesterol levels are high.  You are older than 36 years of age.  You are at high risk for heart disease. What should I know about cancer screening? Many types of cancers can be detected early and may often be prevented. Depending on your health history and family history, you may need to have cancer screening at various ages. This may include screening for:  Colorectal cancer.  Prostate cancer.  Skin cancer.  Lung cancer. What should I know about heart disease, diabetes, and high blood pressure? Blood pressure and heart disease  High blood pressure causes heart disease and increases the risk of stroke. This is more likely to develop in people who have high blood pressure readings, are of African descent, or are overweight.  Talk with your health care provider about your target blood pressure readings.  Have your blood pressure checked: ? Every 3-5 years if you are 18-39 years of age. ? Every year if you are 40 years old or older.  If you are between the ages of 65 and 75 and are a current or former smoker, ask your health care provider if you should have a one-time screening for abdominal aortic aneurysm (AAA). Diabetes Have regular diabetes screenings. This checks your fasting blood sugar level. Have the screening done:  Once every three years after age 45 if you are at   a normal weight and have a low risk for diabetes.  More often and at a younger age if you are overweight or have a high risk for diabetes. What should I know about preventing infection? Hepatitis B If you have a higher risk for hepatitis B, you should be screened for this virus. Talk with your health care provider to find out if you are at risk for hepatitis B infection. Hepatitis C Blood  testing is recommended for:  Everyone born from 1945 through 1965.  Anyone with known risk factors for hepatitis C. Sexually transmitted infections (STIs)  You should be screened each year for STIs, including gonorrhea and chlamydia, if: ? You are sexually active and are younger than 36 years of age. ? You are older than 36 years of age and your health care provider tells you that you are at risk for this type of infection. ? Your sexual activity has changed since you were last screened, and you are at increased risk for chlamydia or gonorrhea. Ask your health care provider if you are at risk.  Ask your health care provider about whether you are at high risk for HIV. Your health care provider may recommend a prescription medicine to help prevent HIV infection. If you choose to take medicine to prevent HIV, you should first get tested for HIV. You should then be tested every 3 months for as long as you are taking the medicine. Follow these instructions at home: Lifestyle  Do not use any products that contain nicotine or tobacco, such as cigarettes, e-cigarettes, and chewing tobacco. If you need help quitting, ask your health care provider.  Do not use street drugs.  Do not share needles.  Ask your health care provider for help if you need support or information about quitting drugs. Alcohol use  Do not drink alcohol if your health care provider tells you not to drink.  If you drink alcohol: ? Limit how much you have to 0-2 drinks a day. ? Be aware of how much alcohol is in your drink. In the [place] one drink equals one 12 oz bottle of beer (355 mL), one 5 oz glass of wine (148 mL), or one 1 oz glass of hard liquor (44 mL). General instructions  Schedule regular health, dental, and eye exams.  Stay current with your vaccines.  Tell your health care provider if: ? You often feel depressed. ? You have ever been abused or do not feel safe at home. Summary  Adopting a healthy  lifestyle and getting preventive care are important in promoting health and wellness.  Follow your health care provider's instructions about healthy diet, exercising, and getting tested or screened for diseases.  Follow your health care provider's instructions on monitoring your cholesterol and blood pressure. This information is not intended to replace advice given to you by your health care provider. Make sure you discuss any questions you have with your health care provider. Document Released: 09/13/2007 Document Revised: 03/10/2018 Document Reviewed: 03/10/2018 Elsevier Patient Education  2020 [employer]  

## 2018-11-08 NOTE — Progress Notes (Signed)
Subjective:    Patient ID: Carl Price, male    DOB: 09-26-1980, 38 y.o.   MRN: [id number]  HPI He is here for a physical exam.   He has not concerns.  He is interested in having blood work done.  He is worried about his family history of diabetes.  He knows he is not eating as well as he should be.  Medications and allergies reviewed with patient and updated if appropriate.  Patient Active Problem List   Diagnosis Date Noted  . Family history of diabetes mellitus in father 09/28/2017  . Hyperlipidemia 09/28/2017  . 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 Anxiety 02/19/2015    Current Outpatient Medications on File Prior to Visit  Medication Sig Dispense Refill  . sertraline (ZOLOFT) 100 MG tablet TAKE 1 AND 1/2 TABLETS(150 MG) BY MOUTH DAILY. Need office visit for more refills. 45 tablet 0   No current facility-administered medications on file prior to visit.     Past Medical History:  Diagnosis Date  . Anxiety   . Depression     Past Surgical History:  Procedure Laterality Date  . WISDOM TOOTH EXTRACTION      Social History   Socioeconomic History  . Marital status: Married    Spouse name: Not on file  . Number of children: Not on file  . Years of education: Not on file  . Highest education level: Not on file  Occupational History  . Not on file  Social Needs  . Financial resource strain: Not on file  . Food insecurity    Worry: Not on file    Inability: Not on file  . Transportation needs    Medical: Not on file    Non-medical: Not on file  Tobacco Use  . Smoking status: Never Smoker  . Smokeless tobacco: Never Used  Substance and Sexual Activity  . Alcohol use: Yes    Alcohol/week: 0.0 standard drinks    Comment: social  . Drug use: No  . Sexual activity: Not on file  Lifestyle  . Physical activity    Days per week: Not on file    Minutes per session: Not on file  . Stress: Not on file  Relationships  . Social Herbalist on  phone: Not on file    Gets together: Not on file    Attends religious service: Not on file    Active member of club or organization: Not on file    Attends meetings of clubs or organizations: Not on file    Relationship status: Not on file  Other Topics Concern  . Not on file  Social History Narrative   Exercise: walking irregularly       Family History  Problem Relation Age of Onset  . Diabetes Father   . Heart disease Father        s/p heart transplant  . Kidney disease Father        related to transplant  . Heart failure Maternal Aunt     Review of Systems  Constitutional: Negative for chills and fever.  Eyes: Negative for visual disturbance.  Respiratory: Negative for cough, shortness of breath and wheezing.   Cardiovascular: Negative for chest pain, palpitations and leg swelling.  Gastrointestinal: Negative for abdominal pain, blood in stool, constipation, diarrhea and nausea.       No gerd  Genitourinary: Negative for dysuria and hematuria.  Musculoskeletal: Positive for back pain (when he wakes up, occ  with yard work). Negative for arthralgias.  Skin: Negative for rash.  Neurological: Negative for light-headedness, numbness and headaches.  Psychiatric/Behavioral: Negative for dysphoric mood. The patient is nervous/anxious (controlled).        Objective:   Vitals:   11/09/18 1431  BP: 118/70  Pulse: 70  Resp: 18  Temp: 98.4 F (36.9 C)  SpO2: 98%   Filed Weights   11/09/18 1431  Weight: 226 lb 12.8 oz (102.9 kg)   Body mass index is 29.92 kg/m.  Wt Readings from Last 3 Encounters:  11/09/18 226 lb 12.8 oz (102.9 kg)  03/03/18 220 lb 9.6 oz (100.1 kg)  09/28/17 222 lb (100.7 kg)     Physical Exam Constitutional: He appears well-developed and well-nourished. No distress.  HENT:  Head: Normocephalic and atraumatic.  Right Ear: External ear normal.  Left Ear: External ear normal.  Mouth/Throat: Oropharynx is clear and moist.  Normal ear canals  and TM b/l  Eyes: Conjunctivae and EOM are normal.  Neck: Neck supple. No tracheal deviation present. No thyromegaly present.  No carotid bruit  Cardiovascular: Normal rate, regular rhythm, normal heart sounds and intact distal pulses.   No murmur heard. Pulmonary/Chest: Effort normal and breath sounds normal. No respiratory distress. He has no wheezes. He has no rales.  Abdominal: Soft. He exhibits no distension. There is no tenderness.  Genitourinary: deferred  Musculoskeletal: He exhibits no edema.  Lymphadenopathy:   He has no cervical adenopathy.  Skin: Skin is warm and dry. He is not diaphoretic.  Psychiatric: He has a normal mood and affect. His behavior is normal.         Assessment & Plan:   Physical exam: Screening blood work  ordered Immunizations   Up to date  Exercise   Walks a lot Weight  Advised weight loss Diet: he snacks a lot - not healthy snacks.  He does not eat huge meals Skin no concerns Substance abuse  none  See Problem List for Assessment and Plan of chronic medical problems.   Follow-up in 1 year

## 2018-11-09 ENCOUNTER — Ambulatory Visit (INDEPENDENT_AMBULATORY_CARE_PROVIDER_SITE_OTHER): Payer: BC Managed Care – PPO | Admitting: Internal Medicine

## 2018-11-09 ENCOUNTER — Encounter: Payer: Self-pay | Admitting: Internal Medicine

## 2018-11-09 ENCOUNTER — Other Ambulatory Visit (INDEPENDENT_AMBULATORY_CARE_PROVIDER_SITE_OTHER): Payer: BC Managed Care – PPO

## 2018-11-09 VITALS — BP 118/70 | HR 70 | Temp 98.4°F | Resp 18 | Ht 73.0 in | Wt 226.8 lb

## 2018-11-09 DIAGNOSIS — Z833 Family history of diabetes mellitus: Secondary | ICD-10-CM

## 2018-11-09 DIAGNOSIS — F419 Anxiety disorder, unspecified: Secondary | ICD-10-CM | POA: Diagnosis not present

## 2018-11-09 DIAGNOSIS — E782 Mixed hyperlipidemia: Secondary | ICD-10-CM | POA: Diagnosis not present

## 2018-11-09 DIAGNOSIS — Z0000 Encounter for general adult medical examination without abnormal findings: Secondary | ICD-10-CM | POA: Diagnosis not present

## 2018-11-09 LAB — CBC WITH DIFFERENTIAL/PLATELET
Basophils Absolute: 0.1 10*3/uL (ref 0.0–0.1)
Basophils Relative: 1.4 % (ref 0.0–3.0)
Eosinophils Absolute: 0.4 10*3/uL (ref 0.0–0.7)
Eosinophils Relative: 4 % (ref 0.0–5.0)
HCT: 45.7 % (ref 39.0–52.0)
Hemoglobin: 15.3 g/dL (ref 13.0–17.0)
Lymphocytes Relative: 30.5 % (ref 12.0–46.0)
Lymphs Abs: 3.2 10*3/uL (ref 0.7–4.0)
MCHC: 33.4 g/dL (ref 30.0–36.0)
MCV: 85.2 fl (ref 78.0–100.0)
Monocytes Absolute: 0.8 10*3/uL (ref 0.1–1.0)
Monocytes Relative: 7.4 % (ref 3.0–12.0)
Neutro Abs: 5.9 10*3/uL (ref 1.4–7.7)
Neutrophils Relative %: 56.7 % (ref 43.0–77.0)
Platelets: 320 10*3/uL (ref 150.0–400.0)
RBC: 5.36 Mil/uL (ref 4.22–5.81)
RDW: 13.5 % (ref 11.5–15.5)
WBC: 10.5 10*3/uL (ref 4.0–10.5)

## 2018-11-09 LAB — COMPREHENSIVE METABOLIC PANEL
ALT: 16 U/L (ref 0–53)
AST: 15 U/L (ref 0–37)
Albumin: 4.4 g/dL (ref 3.5–5.2)
Alkaline Phosphatase: 87 U/L (ref 39–117)
BUN: 14 mg/dL (ref 6–23)
CO2: 30 mEq/L (ref 19–32)
Calcium: 9.4 mg/dL (ref 8.4–10.5)
Chloride: 101 mEq/L (ref 96–112)
Creatinine, Ser: 1 mg/dL (ref 0.40–1.50)
GFR: 84.37 mL/min (ref >60.00)
Glucose, Bld: 90 mg/dL (ref 70–99)
Potassium: 4.3 mEq/L (ref 3.5–5.1)
Sodium: 137 mEq/L (ref 135–145)
Total Bilirubin: 0.3 mg/dL (ref 0.2–1.2)
Total Protein: 7.1 g/dL (ref 6.0–8.3)

## 2018-11-09 LAB — TSH: TSH: 2.07 u[IU]/mL (ref 0.35–4.50)

## 2018-11-09 LAB — LIPID PANEL
Cholesterol: 182 mg/dL (ref 0–200)
HDL: 31 mg/dL — ABNORMAL LOW (ref >39.00)
NonHDL: 150.79
Total CHOL/HDL Ratio: 6
Triglycerides: 334 mg/dL — ABNORMAL HIGH (ref 0.0–149.0)
VLDL: 66.8 mg/dL — ABNORMAL HIGH (ref 0.0–40.0)

## 2018-11-09 LAB — HEMOGLOBIN A1C: Hgb A1c MFr Bld: 5.6 % (ref 4.6–6.5)

## 2018-11-09 LAB — LDL CHOLESTEROL, DIRECT: Direct LDL: 122 mg/dL

## 2018-11-09 MED ORDER — SERTRALINE HCL 100 MG PO TABS: ORAL_TABLET | ORAL | 3 refills | Status: DC

## 2018-11-09 NOTE — Assessment & Plan Note (Addendum)
Controlled, stable Continue current dose of medication-sertraline 150 mg daily  

## 2018-11-09 NOTE — Assessment & Plan Note (Signed)
A1c Encouraged revising his diet-lower in sugars and carbohydrates Continue regular exercise Work on weight loss

## 2018-11-09 NOTE — Assessment & Plan Note (Signed)
Check lipid panel, CMP, TSH Diet controlled Continue regular exercise Work on improving diet

## 2018-11-11 ENCOUNTER — Encounter: Payer: Self-pay | Admitting: Internal Medicine

## 2019-01-03 ENCOUNTER — Other Ambulatory Visit: Payer: Self-pay | Admitting: Registered"

## 2019-01-03 DIAGNOSIS — Z20822 Contact with and (suspected) exposure to covid-19: Secondary | ICD-10-CM

## 2019-01-05 LAB — NOVEL CORONAVIRUS, NAA: SARS-CoV-2, NAA: NOT DETECTED

## 2019-09-24 ENCOUNTER — Other Ambulatory Visit: Payer: Self-pay | Admitting: Internal Medicine

## 2019-11-12 ENCOUNTER — Other Ambulatory Visit: Payer: Self-pay | Admitting: Internal Medicine

## 2019-11-21 ENCOUNTER — Other Ambulatory Visit (INDEPENDENT_AMBULATORY_CARE_PROVIDER_SITE_OTHER): Payer: BC Managed Care – PPO

## 2019-11-21 ENCOUNTER — Other Ambulatory Visit: Payer: Self-pay

## 2019-11-21 DIAGNOSIS — E782 Mixed hyperlipidemia: Secondary | ICD-10-CM

## 2019-11-21 DIAGNOSIS — E785 Hyperlipidemia, unspecified: Secondary | ICD-10-CM

## 2019-11-21 DIAGNOSIS — Z0000 Encounter for general adult medical examination without abnormal findings: Secondary | ICD-10-CM | POA: Diagnosis not present

## 2019-11-21 DIAGNOSIS — Z833 Family history of diabetes mellitus: Secondary | ICD-10-CM

## 2019-11-21 LAB — CBC WITH DIFFERENTIAL/PLATELET
Basophils Absolute: 0.1 10*3/uL (ref 0.0–0.1)
Basophils Relative: 0.9 % (ref 0.0–3.0)
Eosinophils Absolute: 0.1 10*3/uL (ref 0.0–0.7)
Eosinophils Relative: 1 % (ref 0.0–5.0)
HCT: 43.8 % (ref 39.0–52.0)
Hemoglobin: 15.3 g/dL (ref 13.0–17.0)
Lymphocytes Relative: 32.4 % (ref 12.0–46.0)
Lymphs Abs: 3 10*3/uL (ref 0.7–4.0)
MCHC: 34.8 g/dL (ref 30.0–36.0)
MCV: 84 fl (ref 78.0–100.0)
Monocytes Absolute: 0.6 10*3/uL (ref 0.1–1.0)
Monocytes Relative: 6.1 % (ref 3.0–12.0)
Neutro Abs: 5.5 10*3/uL (ref 1.4–7.7)
Neutrophils Relative %: 59.6 % (ref 43.0–77.0)
Platelets: 284 10*3/uL (ref 150.0–400.0)
RBC: 5.22 Mil/uL (ref 4.22–5.81)
RDW: 13.4 % (ref 11.5–15.5)
WBC: 9.3 10*3/uL (ref 4.0–10.5)

## 2019-11-21 LAB — LIPID PANEL
Cholesterol: 176 mg/dL (ref 0–200)
HDL: 31.3 mg/dL — ABNORMAL LOW (ref >39.00)
LDL Cholesterol: 117 mg/dL — ABNORMAL HIGH (ref 0–99)
NonHDL: 144.56
Total CHOL/HDL Ratio: 6
Triglycerides: 139 mg/dL (ref 0.0–149.0)
VLDL: 27.8 mg/dL (ref 0.0–40.0)

## 2019-11-21 LAB — HEMOGLOBIN A1C: Hgb A1c MFr Bld: 5.5 % (ref 4.6–6.5)

## 2019-11-21 LAB — LDL CHOLESTEROL, DIRECT: Direct LDL: 128 mg/dL

## 2019-11-21 LAB — TSH: TSH: 3.19 u[IU]/mL (ref 0.35–4.50)

## 2019-11-21 NOTE — Patient Instructions (Addendum)
Blood work was reviweed.    All other Health Maintenance issues reviewed.   All recommended immunizations and age-appropriate screenings are up-to-date or discussed.  No immunization administered today.   Medications reviewed and updated.  Changes include :   none  Your prescription(s) have been submitted to your pharmacy. Please take as directed and contact our office if you believe you are having problem(s) with the medication(s).    Please followup in 1 year    Health Maintenance, Male Adopting a healthy lifestyle and getting preventive care are important in promoting health and wellness. Ask your health care provider about:  The right schedule for you to have regular tests and exams.  Things you can do on your own to prevent diseases and keep yourself healthy. What should I know about diet, weight, and exercise? Eat a healthy diet   Eat a diet that includes plenty of vegetables, fruits, low-fat dairy products, and lean protein.  Do not eat a lot of foods that are high in solid fats, added sugars, or sodium. Maintain a healthy weight Body mass index (BMI) is a measurement that can be used to identify possible weight problems. It estimates body fat based on height and weight. Your health care provider can help determine your BMI and help you achieve or maintain a healthy weight. Get regular exercise Get regular exercise. This is one of the most important things you can do for your health. Most adults should:  Exercise for at least 150 minutes each week. The exercise should increase your heart rate and make you sweat (moderate-intensity exercise).  Do strengthening exercises at least twice a week. This is in addition to the moderate-intensity exercise.  Spend less time sitting. Even light physical activity can be beneficial. Watch cholesterol and blood lipids Have your blood tested for lipids and cholesterol at 37 years of age, then have this test every 5 years. You may  need to have your cholesterol levels checked more often if:  Your lipid or cholesterol levels are high.  You are older than 37 years of age.  You are at high risk for heart disease. What should I know about cancer screening? Many types of cancers can be detected early and may often be prevented. Depending on your health history and family history, you may need to have cancer screening at various ages. This may include screening for:  Colorectal cancer.  Prostate cancer.  Skin cancer.  Lung cancer. What should I know about heart disease, diabetes, and high blood pressure? Blood pressure and heart disease  High blood pressure causes heart disease and increases the risk of stroke. This is more likely to develop in people who have high blood pressure readings, are of African descent, or are overweight.  Talk with your health care provider about your target blood pressure readings.  Have your blood pressure checked: ? Every 3-5 years if you are 51-75 years of age. ? Every year if you are 59 years old or older.  If you are between the ages of 11 and 18 and are a current or former smoker, ask your health care provider if you should have a one-time screening for abdominal aortic aneurysm (AAA). Diabetes Have regular diabetes screenings. This checks your fasting blood sugar level. Have the screening done:  Once every three years after age 43 if you are at a normal weight and have a low risk for diabetes.  More often and at a younger age if you are overweight or have a  high risk for diabetes. What should I know about preventing infection? Hepatitis B If you have a higher risk for hepatitis B, you should be screened for this virus. Talk with your health care provider to find out if you are at risk for hepatitis B infection. Hepatitis C Blood testing is recommended for:  Everyone born from [birth year] through 1965.  Anyone with known risk factors for hepatitis C. Sexually transmitted  infections (STIs)  You should be screened each year for STIs, including gonorrhea and chlamydia, if: ? You are sexually active and are younger than 37 years of age. ? You are older than 37 years of age and your health care provider tells you that you are at risk for this type of infection. ? Your sexual activity has changed since you were last screened, and you are at increased risk for chlamydia or gonorrhea. Ask your health care provider if you are at risk.  Ask your health care provider about whether you are at high risk for HIV. Your health care provider may recommend a prescription medicine to help prevent HIV infection. If you choose to take medicine to prevent HIV, you should first get tested for HIV. You should then be tested every 3 months for as long as you are taking the medicine. Follow these instructions at home: Lifestyle  Do not use any products that contain nicotine or tobacco, such as cigarettes, e-cigarettes, and chewing tobacco. If you need help quitting, ask your health care provider.  Do not use street drugs.  Do not share needles.  Ask your health care provider for help if you need support or information about quitting drugs. Alcohol use  Do not drink alcohol if your health care provider tells you not to drink.  If you drink alcohol: ? Limit how much you have to 0-2 drinks a day. ? Be aware of how much alcohol is in your drink. In the [place] one drink equals one 12 oz bottle of beer (355 mL), one 5 oz glass of wine (148 mL), or one 1 oz glass of hard liquor (44 mL). General instructions  Schedule regular health, dental, and eye exams.  Stay current with your vaccines.  Tell your health care provider if: ? You often feel depressed. ? You have ever been abused or do not feel safe at home. Summary  Adopting a healthy lifestyle and getting preventive care are important in promoting health and wellness.  Follow your health care provider's instructions about  healthy diet, exercising, and getting tested or screened for diseases.  Follow your health care provider's instructions on monitoring your cholesterol and blood pressure. This information is not intended to replace advice given to you by your health care provider. Make sure you discuss any questions you have with your health care provider. Document Revised: 03/10/2018 Document Reviewed: 03/10/2018 Elsevier Patient Education  2020 [employer].

## 2019-11-21 NOTE — Progress Notes (Signed)
Subjective:    Patient ID: Carl Price, male    DOB: 08-Nov-1980, 39 y.o.   MRN: [id number]  HPI He is here for a physical exam.   He had covid and flu for the first time.  He had the vaccine.    He feels good overall.  He eats well overall - he eats too much.    Medications and allergies reviewed with patient and updated if appropriate.  Patient Active Problem List   Diagnosis Date Noted  . Family history of diabetes mellitus in father 09/28/2017  . Hyperlipidemia 09/28/2017  . 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 Anxiety 02/19/2015    Current Outpatient Medications on File Prior to Visit  Medication Sig Dispense Refill  . sertraline (ZOLOFT) 100 MG tablet TAKE 1 AND 1/2 TABLETS(150 MG) BY MOUTH DAILY.  DUE FOR A FOLLOW UP APPT FOR ADDITIONAL REFILLS 45 tablet 0   No current facility-administered medications on file prior to visit.    Past Medical History:  Diagnosis Date  . Anxiety   . Depression     Past Surgical History:  Procedure Laterality Date  . WISDOM TOOTH EXTRACTION      Social History   Socioeconomic History  . Marital status: Married    Spouse name: Not on file  . Number of children: Not on file  . Years of education: Not on file  . Highest education level: Not on file  Occupational History  . Not on file  Tobacco Use  . Smoking status: Never Smoker  . Smokeless tobacco: Never Used  Substance and Sexual Activity  . Alcohol use: Yes    Alcohol/week: 0.0 standard drinks    Comment: social  . Drug use: No  . Sexual activity: Not on file  Other Topics Concern  . Not on file  Social History Narrative   Exercise: walking irregularly      Social Determinants of Health   Financial Resource Strain:   . Difficulty of Paying Living Expenses: Not on file  Food Insecurity:   . Worried About Programme researcher, broadcasting/film/video in the Last Year: Not on file  . Ran Out of Food in the Last Year: Not on file  Transportation Needs:   . Lack of Transportation  (Medical): Not on file  . Lack of Transportation (Non-Medical): Not on file  Physical Activity:   . Days of Exercise per Week: Not on file  . Minutes of Exercise per Session: Not on file  Stress:   . Feeling of Stress : Not on file  Social Connections:   . Frequency of Communication with Friends and Family: Not on file  . Frequency of Social Gatherings with Friends and Family: Not on file  . Attends Religious Services: Not on file  . Active Member of Clubs or Organizations: Not on file  . Attends Banker Meetings: Not on file  . Marital Status: Not on file    Family History  Problem Relation Age of Onset  . Diabetes Father   . Heart disease Father        s/p heart transplant  . Kidney disease Father        related to transplant  . Heart failure Maternal Aunt     Review of Systems  Constitutional: Negative for chills and fever.  Eyes: Negative for visual disturbance.  Respiratory: Negative for cough, shortness of breath and wheezing.   Cardiovascular: Negative for chest pain, palpitations and leg swelling.  Gastrointestinal: Negative  for abdominal pain, blood in stool, constipation, diarrhea and nausea.       No gerd  Genitourinary: Negative for dysuria and hematuria.  Musculoskeletal: Positive for arthralgias and back pain.  Skin: Negative for color change and rash.  Neurological: Positive for light-headedness (occ) and headaches (occ). Negative for dizziness.  Psychiatric/Behavioral: Negative for dysphoric mood. The patient is nervous/anxious (controlled).        Objective:   Vitals:   11/22/19 1507  BP: 122/82  Pulse: 82  Temp: 98.2 F (36.8 C)  SpO2: 97%   Filed Weights   11/22/19 1507  Weight: 225 lb (102.1 kg)   Body mass index is 29.69 kg/m.  BP Readings from Last 3 Encounters:  11/22/19 122/82  11/09/18 118/70  03/03/18 122/82    Wt Readings from Last 3 Encounters:  11/22/19 225 lb (102.1 kg)  11/09/18 226 lb 12.8 oz (102.9 kg)    03/03/18 220 lb 9.6 oz (100.1 kg)     Physical Exam Constitutional: He appears well-developed and well-nourished. No distress.  HENT:  Head: Normocephalic and atraumatic.  Right Ear: External ear normal.  Left Ear: External ear normal.  Mouth/Throat: Oropharynx is clear and moist.  Normal ear canals and TM b/l  Eyes: Conjunctivae and EOM are normal.  Neck: Neck supple. No tracheal deviation present. No thyromegaly present.  No carotid bruit  Cardiovascular: Normal rate, regular rhythm, normal heart sounds and intact distal pulses.   No murmur heard. Pulmonary/Chest: Effort normal and breath sounds normal. No respiratory distress. He has no wheezes. He has no rales.  Abdominal: Soft. He exhibits no distension. There is no tenderness.  Genitourinary: deferred  Musculoskeletal: He exhibits no edema.  Lymphadenopathy:   He has no cervical adenopathy.  Skin: Skin is warm and dry. He is not diaphoretic.  Psychiatric: He has a normal mood and affect. His behavior is normal.         Assessment & Plan:   Physical exam: Screening blood work  ordered Immunizations   Had covid, rec flu. Discussed HPV Exercise   Very active, no regimented exercise Weight  Overweight - advised weight loss Substance abuse   none  See Problem List for Assessment and Plan of chronic medical problems.   This visit occurred during the SARS-CoV-2 public health emergency.  Safety protocols were in place, including screening questions prior to the visit, additional usage of staff PPE, and extensive cleaning of exam room while observing appropriate contact time as indicated for disinfecting solutions.

## 2019-11-21 NOTE — Addendum Note (Signed)
Addended by: Vincenza Hews on: 11/21/2019 03:38 PM   Modules accepted: Orders

## 2019-11-22 ENCOUNTER — Other Ambulatory Visit: Payer: Self-pay

## 2019-11-22 ENCOUNTER — Encounter: Payer: Self-pay | Admitting: Internal Medicine

## 2019-11-22 ENCOUNTER — Ambulatory Visit (INDEPENDENT_AMBULATORY_CARE_PROVIDER_SITE_OTHER): Payer: BC Managed Care – PPO | Admitting: Internal Medicine

## 2019-11-22 VITALS — BP 122/82 | HR 82 | Temp 98.2°F | Ht 73.0 in | Wt 225.0 lb

## 2019-11-22 DIAGNOSIS — E782 Mixed hyperlipidemia: Secondary | ICD-10-CM | POA: Diagnosis not present

## 2019-11-22 DIAGNOSIS — F419 Anxiety disorder, unspecified: Secondary | ICD-10-CM | POA: Diagnosis not present

## 2019-11-22 DIAGNOSIS — Z833 Family history of diabetes mellitus: Secondary | ICD-10-CM | POA: Diagnosis not present

## 2019-11-22 DIAGNOSIS — Z0000 Encounter for general adult medical examination without abnormal findings: Secondary | ICD-10-CM

## 2019-11-22 MED ORDER — SERTRALINE HCL 100 MG PO TABS
ORAL_TABLET | ORAL | 3 refills | Status: DC
Start: 2019-11-22 — End: 2020-01-31

## 2019-11-22 NOTE — Assessment & Plan Note (Signed)
Chronic Controlled, stable Continue current dose of medication  

## 2019-11-22 NOTE — Assessment & Plan Note (Signed)
Chronic Controlled 

## 2019-11-22 NOTE — Assessment & Plan Note (Signed)
a1c normal encouraged exercise, dec sugar/carbs and weight loss Monitor yearly

## 2020-01-26 ENCOUNTER — Telehealth (INDEPENDENT_AMBULATORY_CARE_PROVIDER_SITE_OTHER): Payer: BC Managed Care – PPO | Admitting: Family Medicine

## 2020-01-26 ENCOUNTER — Other Ambulatory Visit: Payer: Self-pay

## 2020-01-26 DIAGNOSIS — R059 Cough, unspecified: Secondary | ICD-10-CM | POA: Diagnosis not present

## 2020-01-26 DIAGNOSIS — R52 Pain, unspecified: Secondary | ICD-10-CM

## 2020-01-26 DIAGNOSIS — R519 Headache, unspecified: Secondary | ICD-10-CM

## 2020-01-26 NOTE — Progress Notes (Signed)
Virtual Visit via Video Note  I connected with Gustave  on 01/26/20 at 10:00 AM EDT by a video enabled telemedicine application and verified that I am speaking with the correct person using two identifiers.  Location patient: home, [place] Location provider:work or home office Persons participating in the virtual visit: patient, provider  I discussed the limitations of evaluation and management by telemedicine and the availability of in person appointments. The patient expressed understanding and agreed to proceed.   HPI:  Acute telemedicine visit for flu like symptoms: -Onset: yesterday -Symptoms include:body aches, headache, cough, sinus congestion (but has this from time to time anyway with allergies), felt more tired -feels better today - all symptoms resolved  -Denies: fever, SOB, CP, vomiting, worst headache, diarrhea, known sick contacts -Has tried: Tylenol -Pertinent past medical history: see below -Pertinent medication allergies:  -COVID-19 vaccine status: fully vaccinated and had covid, had his flu shot recently - wondered if this could have been a reaction to the flu shot  ROS: See pertinent positives and negatives per HPI.  Past Medical History:  Diagnosis Date  . Anxiety   . Depression     Past Surgical History:  Procedure Laterality Date  . WISDOM TOOTH EXTRACTION       Current Outpatient Medications:  .  sertraline (ZOLOFT) 100 MG tablet, TAKE 1 AND 1/2 TABLETS(150 MG) BY MOUTH DAILY., Disp: 135 tablet, Rfl: 3  EXAM:  VITALS per patient if applicable:  GENERAL: alert, oriented, appears well and in no acute distress  HEENT: atraumatic, conjunttiva clear, no obvious abnormalities on inspection of external nose and ears  NECK: normal movements of the head and neck  LUNGS: on inspection no signs of respiratory distress, breathing rate appears normal, no obvious gross SOB, gasping or wheezing  CV: no obvious cyanosis  MS: moves all visible extremities without  noticeable abnormality  PSYCH/NEURO: pleasant and cooperative, no obvious depression or anxiety, speech and thought processing grossly intact  ASSESSMENT AND PLAN:  Discussed the following assessment and plan:  Body aches  Cough  Nonintractable headache, unspecified chronicity pattern, unspecified headache type  -we discussed possible serious and likely etiologies, options for evaluation and workup, limitations of telemedicine visit vs in person visit, treatment, treatment risks and precautions. Pt prefers to treat via telemedicine empirically rather than in person at this moment.  Viral upper respiratory illness, versus mild influenza, versus breakthrough Covid case versus other.  He had his flu shot about 1 to 2 weeks ago he reports, possible side effects, though seems less likely given timing.  I am glad he is feeling better today and he reports he really does not have any remaining symptoms at this point.  Given we still have high community transmission of Covid, discussed options for Covid testing and provided work note.  Advised a follow-up visit if symptoms return or other concerns arise. Work/School slipped offered: provided in patient instructions   Advised to seek prompt in person care if worsening, new symptoms arise, or if is not improving with treatment. Discussed options for inperson care if PCP office not available. Did let this patient know that I only do telemedicine on Tuesdays and Thursdays for [facility]. Advised to schedule follow up visit with PCP or [facility] if any further questions or concerns to avoid delays in care.   I discussed the assessment and treatment plan with the patient.  Over 20 minutes spent on this visit.  The patient was provided an opportunity to ask questions and all were answered.  The patient agreed with the plan and demonstrated an understanding of the instructions.     Lucretia Kern, DO

## 2020-01-26 NOTE — Patient Instructions (Signed)
   ---------------------------------------------------------------------------------------------------------------------------      WORK SLIP:  Patient Carl Price,  05-May-1980, was seen for a medical visit today, 01/26/20 . Please excuse from work according to the [facility] guidelines for a COVID like illness. We advise 10 days minimum from the onset of symptoms (01/25/2020) PLUS 1 day of no fever and improved symptoms. Will defer to employer for a sooner return to work if COVID19 testing is negative and the symptoms have resolved. Advise following CDC guidelines.  She/He may work remotely from home in self isolation if she is feeling better and wishes to do so.  Sincerely: E-signature: Dr. Kriste Basque, DO [facility] Ph: [phone]   ------------------------------------------------------------------------------------------------------------------------------     It was nice to meet you today, and I am so glad that you are feeling better! I help [place] [facility] out with telemedicine visits on Tuesdays and Thursdays and am available for visits on those days. If you have any concerns or questions following this visit please schedule a follow up visit with your Primary Care doctor or seek care at a local urgent care clinic to avoid delays in care.    Seek in person care promptly if your symptoms return or new concerns arise. Call 911 and/or seek emergency care if you symptoms are severe or life threatening.   -stay home while sick, and while covid test results are pending. If you have COVID19 please stay home for a full 10 days since the onset of symptoms PLUS one day of no fever and feeling better  -[place] COVID19 testing information: [email or web address] [place] [id number] Also available at many pharmacies.   -can use tylenol or aleve if needed for fevers, aches and pains per instructions  -can use nasal saline a few times  per day if nasal congestion  -stay hydrated, drink plenty of fluids and eat small healthy meals - avoid dairy

## 2020-01-31 ENCOUNTER — Other Ambulatory Visit: Payer: Self-pay | Admitting: Internal Medicine

## 2020-12-12 ENCOUNTER — Other Ambulatory Visit: Payer: Self-pay | Admitting: Internal Medicine

## 2021-01-30 ENCOUNTER — Other Ambulatory Visit: Payer: Self-pay | Admitting: Internal Medicine

## 2021-01-30 ENCOUNTER — Telehealth: Payer: Self-pay | Admitting: Internal Medicine

## 2021-01-30 MED ORDER — SERTRALINE HCL 100 MG PO TABS: ORAL_TABLET | ORAL | 0 refills | Status: DC

## 2021-01-30 NOTE — Telephone Encounter (Signed)
1.Medication Requested:  sertraline (ZOLOFT) 100 MG tablet 2. Pharmacy (Name, Street, [place]): [facility] [phone] - [place], [place] - [address] AT [facility] OF [facility] [address]  3. On Med List: yes   4. Last Visit with PCP: 11-22-2019  5. Next visit date with PCP: 02-05-2021   Agent: Please be advised that RX refills may take up to 3 business days. We ask that you follow-up with your pharmacy.    Patient wants to inform provider he is out of medication

## 2021-01-31 ENCOUNTER — Other Ambulatory Visit: Payer: Self-pay

## 2021-01-31 NOTE — Telephone Encounter (Signed)
Refill sent in on yesterday. Must keep appointment or no more refills until he comes in.

## 2021-02-04 NOTE — Progress Notes (Signed)
Subjective:    Patient ID: Carl Smiling, [name], male    DOB: [birth year], 40 y.o.   MRN: [id number]  This visit occurred during the SARS-CoV-2 public health emergency.  Safety protocols were in place, including screening questions prior to the visit, additional usage of staff PPE, and extensive cleaning of exam room while observing appropriate contact time as indicated for disinfecting solutions.     HPI The patient is here for follow up of their chronic medical problems, including anxiety.  He is taking all of his medications as prescribed.  He denies side effects.  He feels the medication is working well.     Medications and allergies reviewed with patient and updated if appropriate.  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 Keep appt for 02/05/21 for additional refills 30 tablet 0   No current facility-administered medications on file prior to visit.    Past Medical History:  Diagnosis Date   Anxiety    Depression     Past Surgical History:  Procedure Laterality Date   WISDOM TOOTH EXTRACTION      Social History   Socioeconomic History   Marital status: Married    Spouse name: Not on file   Number of children: Not on file   Years of education: Not on file   Highest education level: Not on file  Occupational History   Not on file  Tobacco Use   Smoking status: Never   Smokeless tobacco: Never  Substance and Sexual Activity   Alcohol use: Yes    Alcohol/week: 0.0 standard drinks    Comment: social   Drug use: No   Sexual activity: Not on file  Other Topics Concern   Not on file  Social History Narrative   Exercise: walking  irregularly      Social Determinants of Health   Financial Resource Strain: Not on file  Food Insecurity: Not on file  Transportation Needs: Not on file  Physical Activity: Not on file  Stress: Not on file  Social Connections: Not on file    Family History  Problem Relation Age of Onset   Diabetes Father    Heart disease Father        s/p heart transplant   Kidney disease Father        related to transplant   Heart failure Maternal Aunt     Review of Systems  Constitutional:  Negative for appetite change and fever.  Cardiovascular:  Negative for chest pain and palpitations.  Psychiatric/Behavioral:  Negative for dysphoric mood and sleep disturbance. The patient is nervous/anxious.       Objective:   Vitals:   02/05/21 1552  BP: 114/82  Pulse: 70  Temp: 98.4 F (36.9 C)  SpO2: 96%   BP Readings from Last 3 Encounters:  02/05/21 114/82  11/22/19 122/82  11/09/18 118/70   Wt Readings from Last 3 Encounters:  02/05/21 242 lb (109.8 kg)  11/22/19 225 lb (102.1 kg)  11/09/18 226 lb 12.8 oz (102.9 kg)   Body mass index is 31.93 kg/m.   Physical Exam    Constitutional: Appears well-developed and well-nourished. No distress.  HENT:  Head: Normocephalic and atraumatic.  Neck: Neck supple. No tracheal deviation present. No thyromegaly present.  No cervical lymphadenopathy Cardiovascular: Normal rate, regular rhythm and normal heart sounds.   No murmur heard. No carotid bruit .  No edema Pulmonary/Chest: Effort normal and breath sounds normal. No respiratory distress. No has no wheezes. No rales.  Skin: Skin is warm and dry. Not diaphoretic.  Psychiatric: Normal mood and affect. Behavior is normal.      Assessment & Plan:    Screened for depression using the PHQ 9 scale.  No evidence of depression.    See Problem List for Assessment and Plan of chronic medical problems.

## 2021-02-04 NOTE — Patient Instructions (Addendum)
    Medications changes include :   none  Your prescription(s) have been submitted to your pharmacy. Please take as directed and contact our office if you believe you are having problem(s) with the medication(s).    Please followup in 1 year     

## 2021-02-05 ENCOUNTER — Encounter: Payer: Self-pay | Admitting: Internal Medicine

## 2021-02-05 ENCOUNTER — Other Ambulatory Visit: Payer: Self-pay

## 2021-02-05 ENCOUNTER — Ambulatory Visit: Payer: BC Managed Care – PPO | Admitting: Internal Medicine

## 2021-02-05 VITALS — BP 114/82 | HR 70 | Temp 98.4°F | Ht 73.0 in | Wt 242.0 lb

## 2021-02-05 DIAGNOSIS — F419 Anxiety disorder, unspecified: Secondary | ICD-10-CM | POA: Diagnosis not present

## 2021-02-05 DIAGNOSIS — Z1331 Encounter for screening for depression: Secondary | ICD-10-CM

## 2021-02-05 MED ORDER — SERTRALINE HCL 100 MG PO TABS: ORAL_TABLET | ORAL | 3 refills | Status: DC

## 2021-02-05 NOTE — Assessment & Plan Note (Signed)
Chronic Controlled, stable - GAD7 score confirms anxiety is controlled Continue sertraline 150 mg daily

## 2021-02-24 ENCOUNTER — Telehealth: Payer: BC Managed Care – PPO | Admitting: Emergency Medicine

## 2021-02-24 DIAGNOSIS — J019 Acute sinusitis, unspecified: Secondary | ICD-10-CM

## 2021-02-24 DIAGNOSIS — R112 Nausea with vomiting, unspecified: Secondary | ICD-10-CM

## 2021-02-24 DIAGNOSIS — R051 Acute cough: Secondary | ICD-10-CM

## 2021-02-24 MED ORDER — AMOXICILLIN-POT CLAVULANATE 875-125 MG PO TABS: 1.0000 | ORAL_TABLET | Freq: Two times a day (BID) | ORAL | 0 refills | Status: AC

## 2021-02-24 MED ORDER — BENZONATATE 100 MG PO CAPS: 100.0000 mg | ORAL_CAPSULE | Freq: Two times a day (BID) | ORAL | 0 refills | Status: DC | PRN

## 2021-02-24 MED ORDER — ONDANSETRON HCL 4 MG PO TABS: 4.0000 mg | ORAL_TABLET | Freq: Three times a day (TID) | ORAL | 0 refills | Status: DC | PRN

## 2021-02-24 NOTE — Progress Notes (Signed)
Virtual Visit Consent   Carl Smiling, [name], you are scheduled for a virtual visit with a [facility] provider today.     Just as with appointments in the office, your consent must be obtained to participate.  Your consent will be active for this visit and any virtual visit you may have with one of our providers in the next 365 days.     If you have a MyChart account, a copy of this consent can be sent to you electronically.  All virtual visits are billed to your insurance company just like a traditional visit in the office.    As this is a virtual visit, video technology does not allow for your provider to perform a traditional examination.  This may limit your provider's ability to fully assess your condition.  If your provider identifies any concerns that need to be evaluated in person or the need to arrange testing (such as labs, EKG, etc.), we will make arrangements to do so.     Although advances in technology are sophisticated, we cannot ensure that it will always work on either your end or our end.  If the connection with a video visit is poor, the visit may have to be switched to a telephone visit.  With either a video or telephone visit, we are not always able to ensure that we have a secure connection.     I need to obtain your verbal consent now.   Are you willing to proceed with your visit today? yes   Carl Smiling, [name] has provided verbal consent on 02/24/2021 for a virtual visit (video or telephone).   Carl Price, [place]   Date: 02/24/2021 4:20 PM   Virtual Visit via Video Note   I, Carl Price, connected with  Carl Smiling, [name]  ([id number], 1980/12/11) on 02/24/21 at  4:15 PM EST by a video-enabled telemedicine application and verified that I am speaking with the correct person using two identifiers.  Location: Patient: Virtual Visit Location Patient: Home Provider: Virtual Visit Location Provider: Home Office   I discussed the limitations of evaluation and  management by telemedicine and the availability of in person appointments. The patient expressed understanding and agreed to proceed.    History of Present Illness: Carl Smiling, [name] is a 40 y.o. who identifies as a male who was assigned male at birth, and is being seen today for subjective fever, aches, chills, sinus pain/ pressure, dry cough, and vomiting x 1 week.  Dx'ed with flu earlier this week.  Has tried OTC medication without relief.  Symptoms are made worse in the morning.  Denies previous symptoms in the past.   Denies SOB, wheezing, chest pain, changes in bowel or bladder habits.    ROS: As per HPI.  All other pertinent ROS negative.      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   Anxiety 02/19/2015    Allergies: No Known Allergies Medications:  Current Outpatient Medications:    amoxicillin-clavulanate (AUGMENTIN) 875-125 MG tablet, Take 1 tablet by mouth every 12 (twelve) hours for 10 days., Disp: 20 tablet, Rfl: 0   benzonatate (TESSALON) 100 MG capsule, Take 1 capsule (100 mg total) by mouth 2 (two) times daily as needed for cough., Disp: 20 capsule, Rfl: 0   ondansetron (ZOFRAN) 4 MG tablet, Take 1  tablet (4 mg total) by mouth every 8 (eight) hours as needed for nausea or vomiting., Disp: 20 tablet, Rfl: 0   sertraline (ZOLOFT) 100 MG tablet, TAKE 1 AND 1/2 TABLETS(150 MG) BY MOUTH DAILY., Disp: 135 tablet, Rfl: 3  Observations/Objective: Patient is well-developed, well-nourished in no acute distress.  Resting comfortably at home. Fatigued appearing, but nontoxic Head is normocephalic, atraumatic.  No labored breathing. Speaking in full sentences and tolerating own secretions Speech is clear and coherent with logical content.  Patient is alert and oriented at baseline.   Assessment and Plan: 1. Acute  non-recurrent sinusitis, unspecified location  2. Nausea and vomiting, unspecified vomiting type  3. Acute cough Based on symptoms I will treat you for possible secondary bacterial infection follow the flu Get plenty of rest and push fluids Augmentin prescribed for sinus infection Tessalon Perles prescribed for cough Zofran for nausea and vomiting Use OTC zyrtec for nasal congestion, runny nose, and/or sore throat Use OTC flonase for nasal congestion and runny nose Use medications daily for symptom relief Use OTC medications like ibuprofen or tylenol as needed fever or pain Follow up in person with urgent care or go to the ED if you have any new or worsening symptoms such as fever, worsening cough, shortness of breath, chest tightness, chest pain, turning blue, changes in mental status, etc...   Follow Up Instructions: I discussed the assessment and treatment plan with the patient. The patient was provided an opportunity to ask questions and all were answered. The patient agreed with the plan and demonstrated an understanding of the instructions.  A copy of instructions were sent to the patient via MyChart unless otherwise noted below.    The patient was advised to call back or seek an in-person evaluation if the symptoms worsen or if the condition fails to improve as anticipated.  Time:  I spent 10 minutes with the patient via telehealth technology discussing the above problems/concerns.    Carl Harding, PA-C

## 2021-02-24 NOTE — Patient Instructions (Signed)
Carl Smiling, [name], thank you for joining Rennis Harding, PA-C for today's virtual visit.  While this provider is not your primary care provider (PCP), if your PCP is located in our provider database this encounter information will be shared with them immediately following your visit.  Consent: (Patient) Carl Smiling, [name] provided verbal consent for this virtual visit at the beginning of the encounter.  Current Medications:  Current Outpatient Medications:    amoxicillin-clavulanate (AUGMENTIN) 875-125 MG tablet, Take 1 tablet by mouth every 12 (twelve) hours for 10 days., Disp: 20 tablet, Rfl: 0   benzonatate (TESSALON) 100 MG capsule, Take 1 capsule (100 mg total) by mouth 2 (two) times daily as needed for cough., Disp: 20 capsule, Rfl: 0   ondansetron (ZOFRAN) 4 MG tablet, Take 1 tablet (4 mg total) by mouth every 8 (eight) hours as needed for nausea or vomiting., Disp: 20 tablet, Rfl: 0   sertraline (ZOLOFT) 100 MG tablet, TAKE 1 AND 1/2 TABLETS(150 MG) BY MOUTH DAILY., Disp: 135 tablet, Rfl: 3   Medications ordered in this encounter:  Meds ordered this encounter  Medications   amoxicillin-clavulanate (AUGMENTIN) 875-125 MG tablet    Sig: Take 1 tablet by mouth every 12 (twelve) hours for 10 days.    Dispense:  20 tablet    Refill:  0    Order Specific Question:   Supervising Provider    Answer:   MILLER, BRIAN [[id number]]   ondansetron (ZOFRAN) 4 MG tablet    Sig: Take 1 tablet (4 mg total) by mouth every 8 (eight) hours as needed for nausea or vomiting.    Dispense:  20 tablet    Refill:  0    Order Specific Question:   Supervising Provider    Answer:   MILLER, BRIAN [[id number]]   benzonatate (TESSALON) 100 MG capsule    Sig: Take 1 capsule (100 mg total) by mouth 2 (two) times daily as needed for cough.    Dispense:  20 capsule    Refill:  0    Order Specific Question:   Supervising Provider    Answer:   Hyacinth Meeker, BRIAN [[id number]]     *If you need refills on other medications prior  to your next appointment, please contact your pharmacy*  Follow-Up: Call back or seek an in-person evaluation if the symptoms worsen or if the condition fails to improve as anticipated.  Other Instructions Based on symptoms I will treat you for possible secondary bacterial infection follow the flu Get plenty of rest and push fluids Augmentin prescribed for sinus infection Tessalon Perles prescribed for cough Zofran for nausea and vomiting Use OTC zyrtec for nasal congestion, runny nose, and/or sore throat Use OTC flonase for nasal congestion and runny nose Use medications daily for symptom relief Use OTC medications like ibuprofen or tylenol as needed fever or pain Follow up in person with urgent care or go to the ED if you have any new or worsening symptoms such as fever, worsening cough, shortness of breath, chest tightness, chest pain, turning blue, changes in mental status, etc...    If you have been instructed to have an in-person evaluation today at a local Urgent Care facility, please use the link below. It will take you to a list of all of our available [facility], including address, phone number and hours of operation. Please do not delay care.   Urgent Cares  If you or a family member do not have a primary care provider,  use the link below to schedule a visit and establish care. When you choose a [facility] primary care physician or advanced practice provider, you gain a long-term partner in health. Find a Primary Care Provider  Learn more about [facility] in-office and virtual care options: [facility]

## 2021-03-10 ENCOUNTER — Other Ambulatory Visit: Payer: Self-pay | Admitting: Internal Medicine

## 2021-03-29 NOTE — Progress Notes (Signed)
Virtual Visit via Video Note  I connected with Carl Smiling, [name] on 03/29/21 at  9:10 AM EST by a video enabled telemedicine application and verified that I am speaking with the correct person using two identifiers.   I discussed the limitations of evaluation and management by telemedicine and the availability of in person appointments. The patient expressed understanding and agreed to proceed.  Present for the visit:  Myself, Dr Cheryll Cockayne, Carl Price.  The patient is currently at home and I am in the office.    No referring provider.    History of Present Illness: This is an acute visit to discuss weight loss  His BMI on 02/05/21 was 31.93  He is interested in trying medication to help with weight loss. His appetite is big and he eats too much.  He plans on starting a diet and exercise.  He knows that will help, but feels he needs more than that.  He has never tried any medication.  He is taking his sertraline 150 mg daily and feels like that is working well and does not want to change anything.     Review of Systems  Cardiovascular:  Negative for chest pain and palpitations.  Neurological:  Negative for dizziness and headaches.      Social History   Socioeconomic History   Marital status: Married    Spouse name: Not on file   Number of children: Not on file   Years of education: Not on file   Highest education level: Not on file  Occupational History   Not on file  Tobacco Use   Smoking status: Never   Smokeless tobacco: Never  Substance and Sexual Activity   Alcohol use: Yes    Alcohol/week: 0.0 standard drinks    Comment: social   Drug use: No   Sexual activity: Not on file  Other Topics Concern   Not on file  Social History Narrative   Exercise: walking irregularly      Social Determinants of Health   Financial Resource Strain: Not on file  Food Insecurity: Not on file  Transportation Needs: Not on file  Physical Activity: Not on file  Stress:  Not on file  Social Connections: Not on file     Observations/Objective: Appears well in NAD Breathing normally Skin appears warm and dry Mood appears normal  Assessment and Plan:  See Problem List for Assessment and Plan of chronic medical problems.   Follow Up Instructions:    I discussed the assessment and treatment plan with the patient. The patient was provided an opportunity to ask questions and all were answered. The patient agreed with the plan and demonstrated an understanding of the instructions.   The patient was advised to call back or seek an in-person evaluation if the symptoms worsen or if the condition fails to improve as anticipated.    Pincus Sanes, MD

## 2021-04-02 ENCOUNTER — Other Ambulatory Visit: Payer: Self-pay

## 2021-04-02 ENCOUNTER — Encounter: Payer: Self-pay | Admitting: Internal Medicine

## 2021-04-02 ENCOUNTER — Telehealth (INDEPENDENT_AMBULATORY_CARE_PROVIDER_SITE_OTHER): Payer: BC Managed Care – PPO | Admitting: Internal Medicine

## 2021-04-02 ENCOUNTER — Other Ambulatory Visit: Payer: Self-pay | Admitting: Internal Medicine

## 2021-04-02 DIAGNOSIS — E669 Obesity, unspecified: Secondary | ICD-10-CM | POA: Diagnosis not present

## 2021-04-02 DIAGNOSIS — Z833 Family history of diabetes mellitus: Secondary | ICD-10-CM | POA: Diagnosis not present

## 2021-04-02 DIAGNOSIS — F419 Anxiety disorder, unspecified: Secondary | ICD-10-CM | POA: Diagnosis not present

## 2021-04-02 MED ORDER — SEMAGLUTIDE-WEIGHT MANAGEMENT 0.25 MG/0.5ML ~~LOC~~ SOAJ: 0.2500 mg | SUBCUTANEOUS | 0 refills | Status: DC

## 2021-04-02 NOTE — Assessment & Plan Note (Signed)
Chronic Check a1c Low sugar / carb diet Stressed regular exercise  

## 2021-04-02 NOTE — Assessment & Plan Note (Signed)
Chronic Controlled, Stable Continue sertraline 150 mg daily 

## 2021-04-02 NOTE — Assessment & Plan Note (Signed)
Chronic This is a virtual visit so I do not know his current BMI is, but 2 months ago it was 31.93 Stressed decreasing portions, high in protein and vegetables and low in sugars and carbohydrates Stressed regular exercise-ideally doing cardio and weightlifting Discussed possibly seeing nutritionist Discussed medication options We will try Wegovy 0.25 mg weekly-he will need to use a savings card for this and if tolerated we will increase this after 1 month.  Discussed possible side effects Stressed the importance of lifestyle changes in order for this medication to help We will check basic blood work and determine follow-up

## 2021-04-06 MED ORDER — SAXENDA 18 MG/3ML ~~LOC~~ SOPN: 0.6000 mg | PEN_INJECTOR | Freq: Every day | SUBCUTANEOUS | 2 refills | Status: DC

## 2021-04-11 ENCOUNTER — Telehealth: Payer: Self-pay

## 2021-04-11 NOTE — Telephone Encounter (Signed)
PA for Saxenda 18MG /3ML pen-injectors  Key:  Your PA request has been approved.

## 2021-05-04 ENCOUNTER — Other Ambulatory Visit: Payer: Self-pay | Admitting: Internal Medicine

## 2021-05-06 MED ORDER — SEMAGLUTIDE-WEIGHT MANAGEMENT 0.5 MG/0.5ML ~~LOC~~ SOAJ: 0.5000 mg | SUBCUTANEOUS | 0 refills | Status: DC

## 2021-05-06 NOTE — Addendum Note (Signed)
Addended by: Pincus Sanes on: 05/06/2021 09:05 PM   Modules accepted: Orders

## 2021-05-30 ENCOUNTER — Telehealth (INDEPENDENT_AMBULATORY_CARE_PROVIDER_SITE_OTHER): Payer: BC Managed Care – PPO | Admitting: Family Medicine

## 2021-05-30 ENCOUNTER — Encounter: Payer: Self-pay | Admitting: Family Medicine

## 2021-05-30 DIAGNOSIS — J029 Acute pharyngitis, unspecified: Secondary | ICD-10-CM | POA: Diagnosis not present

## 2021-05-30 LAB — POCT RAPID STREP A (OFFICE): Rapid Strep A Screen: NEGATIVE

## 2021-05-30 NOTE — Progress Notes (Signed)
Virtual Visit via Video Note ? ?I connected with Carl Price ? on 05/30/21 at  3:40 PM EST by a video enabled telemedicine application and verified that I am speaking with the correct person using two identifiers. ? Location patient: [place] ?Location provider:work or home office ?Persons participating in the virtual visit: patient, provider ? ?I discussed the limitations and requested verbal permission for telemedicine visit. The patient expressed understanding and agreed to proceed. ? ? ?HPI: ? ?Acute telemedicine visit for a sore throat: ?-Onset: ?-Symptoms include: ha, chills last night, feels like has fever, really sore throat ?-around a lot of kids, went to [place] this [place] ?-covid test today negative ?-Denies:vomiting, diarrhea, SOB, cough, nasal congestion ?-has been able to drink fluid ?-Has tried: ?-Pertinent past medical history: see below; had a flu and covid shot 2 days ago - but these symptoms started before the shots ?-Pertinent medication allergies:No Known Allergies ?-COVID-19 vaccine status:  ?Immunization History  ?Administered Date(s) Administered  ? Influenza,inj,Quad PF,6+ Mos 02/19/2015, 02/19/2016, 12/26/2017  ? Influenza-Unspecified 05/28/2021  ? Pfizer Covid Bivalent Pediatric Vaccine(68mos to <25yrs) 05/28/2021  ? Tdap 03/26/2016  ? ? ? ?ROS: See pertinent positives and negatives per HPI. ? ?Past Medical History:  ?Diagnosis Date  ? Anxiety   ? Depression   ? ? ?Past Surgical History:  ?Procedure Laterality Date  ? WISDOM TOOTH EXTRACTION    ? ? ? ?Current Outpatient Medications:  ?  [START ON 06/04/2021] Semaglutide-Weight Management 0.5 MG/0.5ML SOAJ, Inject 0.5 mg into the skin once a week for 28 days., Disp: 2 mL, Rfl: 0 ?  sertraline (ZOLOFT) 100 MG tablet, TAKE 1 AND 1/2 TABLETS(150 MG) BY MOUTH DAILY, Disp: 135 tablet, Rfl: 3 ? ?EXAM: ? ?VITALS per patient if applicable: ? ?GENERAL: alert, oriented, appears well and in no acute distress ? ?HEENT: atraumatic, conjunttiva clear, no obvious  abnormalities on inspection of external nose and ears, moist mucus membranes, mild tonsillar edema and erythema - no appreciable exudate on video visit exam ? ?NECK: normal movements of the head and neck ? ?LUNGS: on inspection no signs of respiratory distress, breathing rate appears normal, no obvious gross SOB, gasping or wheezing ? ?CV: no obvious cyanosis ? ?MS: moves all visible extremities without noticeable abnormality ? ?PSYCH/NEURO: pleasant and cooperative, no obvious depression or anxiety, speech and thought processing grossly intact ? ?ASSESSMENT AND PLAN: ? ?Discussed the following assessment and plan: ? ?Sore throat ? ?-we discussed possible serious and likely etiologies, options for evaluation and workup, limitations of telemedicine visit vs in person visit, treatment, treatment risks and precautions. Pt is agreeable to treatment via telemedicine at this moment. Query allergies with vaccine reaction, viral illness, possible strep throat vs other. He has opted to come to the office for strep testing - will tx with abx if positive. He plans to repeat covid testing in 48 hours. Opted for warm salt water gargles and naproxen for symptoms.  ?Work/School slipped offered: provided in patient instructions   ?Advised to seek prompt virtual visit or in person care if worsening, new symptoms arise, or if is not improving with treatment as expected per our conversation of expected course. Discussed options for follow up care. Did let this patient know that I do telemedicine on Tuesdays and Thursdays for [facility] and those are the days I am logged into the system. Advised to schedule follow up visit with PCP, [facility] virtual visits or [facility] if any further questions or concerns to avoid delays in care. ?  ?I  discussed the assessment and treatment plan with the patient. The patient was provided an opportunity to ask questions and all were answered. The patient agreed with the plan and demonstrated an understanding  of the instructions. ?  ? ? ?Terressa Koyanagi, DO  ? ?

## 2021-05-30 NOTE — Patient Instructions (Addendum)
? ? ? ?--------------------------------------------------------------------------------------------------------------------------- ? ? ? ?  WORK SLIP: ? ?Patient Carl Smiling, [name],  Jun 17, 1980, was seen for a medical visit today, 05/30/21 . Please excuse from work for a COVID/flu like illness. If Covid19 testing is positive he will likely be contagious for 7-10 days - follow CDC guidelines for isolation of a minimum of 5 days at home and 5 additional days wearing a good mask at all times. Otherwise, advise 2 covid test 48 hours apart negative and fever resolved for 24 hours before return to work.  ? ?Sincerely: ?E-signature: Dr. Kriste Basque, DO ?[facility] ?Ph: [phone] ? ? ?------------------------------------------------------------------------------------------------------------------------------ ? ?-warm salt water gargles twice daily ? ?-aleve 1-2 pills twice daily for 3 days if needed ? ?-drink plenty of fluids ? ?-recheck covid test in 48 hours ? ? ?I hope you are feeling better soon! ? ?Seek in person care promptly if your symptoms worsen, new concerns arise or you are not improving with treatment. ? ?It was nice to meet you today. I help [facility] out with telemedicine visits on Tuesdays and Thursdays and am happy to help if you need a virtual follow up visit on those days. Otherwise, if you have any concerns or questions following this visit please schedule a follow up visit with your Primary Care office or seek care at a local urgent care clinic to avoid delays in care ? ? ?

## 2021-06-01 ENCOUNTER — Encounter: Payer: Self-pay | Admitting: Family Medicine

## 2021-06-07 ENCOUNTER — Other Ambulatory Visit: Payer: Self-pay | Admitting: Internal Medicine

## 2021-06-10 ENCOUNTER — Ambulatory Visit: Payer: BC Managed Care – PPO | Admitting: Internal Medicine

## 2021-07-16 ENCOUNTER — Other Ambulatory Visit: Payer: Self-pay | Admitting: Internal Medicine

## 2021-07-17 MED ORDER — WEGOVY 1 MG/0.5ML ~~LOC~~ SOAJ: 1.0000 mg | SUBCUTANEOUS | 0 refills | Status: DC

## 2021-07-17 NOTE — Telephone Encounter (Signed)
Rec'd msg stating " I think I need to move up to the next dose." Pls advise on refill../l,mb ?

## 2021-07-22 ENCOUNTER — Encounter: Payer: Self-pay | Admitting: Internal Medicine

## 2021-07-23 ENCOUNTER — Other Ambulatory Visit (INDEPENDENT_AMBULATORY_CARE_PROVIDER_SITE_OTHER): Payer: BC Managed Care – PPO

## 2021-07-23 ENCOUNTER — Other Ambulatory Visit: Payer: BC Managed Care – PPO

## 2021-07-23 ENCOUNTER — Other Ambulatory Visit: Payer: Self-pay

## 2021-07-23 DIAGNOSIS — E782 Mixed hyperlipidemia: Secondary | ICD-10-CM

## 2021-07-23 DIAGNOSIS — Z833 Family history of diabetes mellitus: Secondary | ICD-10-CM | POA: Diagnosis not present

## 2021-07-23 DIAGNOSIS — Z113 Encounter for screening for infections with a predominantly sexual mode of transmission: Secondary | ICD-10-CM

## 2021-07-23 LAB — COMPREHENSIVE METABOLIC PANEL
ALT: 21 U/L (ref 0–53)
AST: 18 U/L (ref 0–37)
Albumin: 4.3 g/dL (ref 3.5–5.2)
Alkaline Phosphatase: 81 U/L (ref 39–117)
BUN: 14 mg/dL (ref 6–23)
CO2: 29 mEq/L (ref 19–32)
Calcium: 9.3 mg/dL (ref 8.4–10.5)
Chloride: 103 mEq/L (ref 96–112)
Creatinine, Ser: 0.93 mg/dL (ref 0.40–1.50)
GFR: 103.75 mL/min (ref >60.00)
Glucose, Bld: 95 mg/dL (ref 70–99)
Potassium: 3.8 mEq/L (ref 3.5–5.1)
Sodium: 138 mEq/L (ref 135–145)
Total Bilirubin: 0.4 mg/dL (ref 0.2–1.2)
Total Protein: 7.2 g/dL (ref 6.0–8.3)

## 2021-07-23 LAB — HEMOGLOBIN A1C: Hgb A1c MFr Bld: 5.7 % (ref 4.6–6.5)

## 2021-07-24 LAB — HIV ANTIBODY (ROUTINE TESTING W REFLEX): HIV 1&2 Ab, 4th Generation: NONREACTIVE

## 2021-08-12 ENCOUNTER — Telehealth: Payer: Self-pay

## 2021-08-12 NOTE — Telephone Encounter (Signed)
(  Key: [id number]) ? ?Your information has been submitted to [facility]. To check for an updated outcome later, reopen this PA request from your dashboard. ? ?If [facility] has not responded to your request within 24 hours, contact [facility] at [phone]. If you think there may be a problem with your PA request, use our live chat feature at the bottom right. ?

## 2021-09-04 ENCOUNTER — Other Ambulatory Visit: Payer: Self-pay | Admitting: Internal Medicine

## 2021-09-06 MED ORDER — WEGOVY 1 MG/0.5ML ~~LOC~~ SOAJ: 1.0000 mg | SUBCUTANEOUS | 0 refills | Status: DC

## 2021-09-12 ENCOUNTER — Ambulatory Visit (INDEPENDENT_AMBULATORY_CARE_PROVIDER_SITE_OTHER): Payer: BC Managed Care – PPO

## 2021-09-12 ENCOUNTER — Ambulatory Visit
Admission: RE | Admit: 2021-09-12 | Discharge: 2021-09-12 | Disposition: A | Discharge status: Home / Self Care | Payer: BC Managed Care – PPO | Source: Ambulatory Visit | Attending: Urgent Care | Admitting: Urgent Care

## 2021-09-12 VITALS — BP 124/84 | HR 81 | Temp 98.3°F | Resp 18

## 2021-09-12 DIAGNOSIS — M542 Cervicalgia: Secondary | ICD-10-CM

## 2021-09-12 DIAGNOSIS — M436 Torticollis: Secondary | ICD-10-CM | POA: Diagnosis not present

## 2021-09-12 MED ORDER — CYCLOBENZAPRINE HCL 10 MG PO TABS: 10.0000 mg | ORAL_TABLET | Freq: Two times a day (BID) | ORAL | 0 refills | Status: DC | PRN

## 2021-09-12 MED ORDER — MELOXICAM 15 MG PO TABS: 15.0000 mg | ORAL_TABLET | Freq: Every day | ORAL | 1 refills | Status: DC

## 2021-09-12 MED ORDER — KETOROLAC TROMETHAMINE 60 MG/2ML IM SOLN
60.0000 mg | Freq: Once | INTRAMUSCULAR | Status: AC
  Administered 2021-09-12: 60 mg via INTRAMUSCULAR

## 2021-09-12 NOTE — ED Provider Notes (Signed)
[facility]   MRN: [id number] DOB: 09-03-1980  Subjective:   Carl Smiling, Carl Price is a 41 y.o. male presenting for acute on chronic right-sided neck pain with stiffness and decreased range of motion.  The pain can radiate into the trapezius and back into the shoulder blade on the right side.  Has had the pain for 10 years off-and-on.  This seems to be more intense for him.  He has seen a chiropractor over the years.  Had x-rays done with them that did not result with anything specific.  Has never had an MRI or spine specialist to evaluate him.  No weakness, numbness or tingling.   Current Facility-Administered Medications:    ketorolac (TORADOL) injection 60 mg, 60 mg, Intramuscular, Once, Wallis Bamberg, PA-C  Current Outpatient Medications:    cyclobenzaprine (FLEXERIL) 10 MG tablet, Take 1 tablet (10 mg total) by mouth 2 (two) times daily as needed for muscle spasms., Disp: 30 tablet, Rfl: 0   meloxicam (MOBIC) 15 MG tablet, Take 1 tablet (15 mg total) by mouth daily., Disp: 30 tablet, Rfl: 1   Semaglutide-Weight Management (WEGOVY) 1 MG/0.5ML SOAJ, Inject 1 mg into the skin once a week., Disp: 2 mL, Rfl: 0   sertraline (ZOLOFT) 100 MG tablet, TAKE 1 AND 1/2 TABLETS(150 MG) BY MOUTH DAILY, Disp: 135 tablet, Rfl: 3   No Known Allergies  Past Medical History:  Diagnosis Date   Anxiety    Depression      Past Surgical History:  Procedure Laterality Date   WISDOM TOOTH EXTRACTION      Family History  Problem Relation Age of Onset   Diabetes Father    Heart disease Father        s/p heart transplant   Kidney disease Father        related to transplant   Heart failure Maternal Aunt     Social History   Tobacco Use   Smoking status: Never   Smokeless tobacco: Never  Substance Use Topics   Alcohol use: Yes    Alcohol/week: 0.0 standard drinks of alcohol    Comment: social   Drug use: No    ROS   Objective:   Vitals: BP 124/84 (BP Location:  Left Arm)   Pulse 81   Temp 98.3 F (36.8 C) (Oral)   Resp 18   SpO2 96%   Physical Exam Constitutional:      General: He is not in acute distress.    Appearance: Normal appearance. He is well-developed and normal weight. He is not ill-appearing, toxic-appearing or diaphoretic.  HENT:     Head: Normocephalic and atraumatic.     Right Ear: External ear normal.     Left Ear: External ear normal.     Nose: Nose normal.     Mouth/Throat:     Pharynx: Oropharynx is clear.  Eyes:     General: No scleral icterus.       Right eye: No discharge.        Left eye: No discharge.     Extraocular Movements: Extraocular movements intact.  Cardiovascular:     Rate and Rhythm: Normal rate.  Pulmonary:     Effort: Pulmonary effort is normal.  Musculoskeletal:     Cervical back: Spasms, torticollis (to the right) and tenderness (over area outlined, worse at the base of the neck) present. No swelling, edema, deformity, erythema, signs of trauma, lacerations, rigidity, bony tenderness or crepitus. Pain with movement present. Decreased  range of motion (in all directions).       Back:  Neurological:     Mental Status: He is alert and oriented to person, place, and time.  Psychiatric:        Mood and Affect: Mood normal.        Behavior: Behavior normal.        Thought Content: Thought content normal.        Judgment: Judgment normal.     DG Cervical Spine Complete  Result Date: 09/12/2021 CLINICAL DATA:  Right-sided neck pain with limited range of motion EXAM: CERVICAL SPINE - COMPLETE 4+ VIEW COMPARISON:  None FINDINGS: Curvature of the spine convex towards the left which could be positional. Straightening of the normal cervical lordosis. No visible osteophytic narrowing of the canal or foramina. No gross facet arthritic changes by radiography. Disc space heights are within normal limits. IMPRESSION: Curvature of the spine convex to the left. Straightening of the normal lordosis. No other focal  finding. Electronically Signed   By: Paulina Fusi M.D.   On: 09/12/2021 09:28     Assessment and Plan :   PDMP not reviewed this encounter.  1. Torticollis   2. Neck pain    Emphasized need to hydrate much better.  Patient does well with meloxicam and therefore I prescribed this.  Use Flexeril as well.  Will defer steroid use for now.  As patient has had chronic neck pain for years recommended a consultation with [facility].  Patient will contact them at his leisure. Counseled patient on potential for adverse effects with medications prescribed/recommended today, ER and return-to-clinic precautions discussed, patient verbalized understanding.    Wallis Bamberg, [place] 09/12/21 [phone]

## 2021-09-12 NOTE — ED Notes (Signed)
ON/off Neck Pain for 10 years, Every year 3 to 4 time patient get extreme right sided neck pain with limited ROM, No radiating pain. Normally sees a Chiropractor but this time the Pain is more intense. 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   Pain 8/10 Sharp shooting

## 2021-09-12 NOTE — ED Triage Notes (Signed)
ON/off Neck Pain for 10 years, Every year 3 to 4 time patient get extreme right sided neck pain with limited ROM, No radiating pain. Normally sees a Chiropractor but this time the Pain is more intense. 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

## 2021-10-07 ENCOUNTER — Other Ambulatory Visit: Payer: Self-pay | Admitting: Internal Medicine

## 2021-10-08 MED ORDER — WEGOVY 1 MG/0.5ML ~~LOC~~ SOAJ: 1.0000 mg | SUBCUTANEOUS | 0 refills | Status: DC

## 2021-11-04 ENCOUNTER — Encounter: Payer: Self-pay | Admitting: Internal Medicine

## 2021-11-05 ENCOUNTER — Other Ambulatory Visit (HOSPITAL_COMMUNITY): Payer: Self-pay | Admitting: Neurological Surgery

## 2021-11-05 ENCOUNTER — Other Ambulatory Visit (HOSPITAL_BASED_OUTPATIENT_CLINIC_OR_DEPARTMENT_OTHER): Payer: Self-pay | Admitting: Neurological Surgery

## 2021-11-05 ENCOUNTER — Telehealth: Payer: Self-pay

## 2021-11-05 DIAGNOSIS — M542 Cervicalgia: Secondary | ICD-10-CM

## 2021-11-05 NOTE — Telephone Encounter (Signed)
Key: [id number]

## 2021-11-06 NOTE — Telephone Encounter (Signed)
Script approved and sent message to patient via my-chart regarding approval.

## 2021-11-08 ENCOUNTER — Ambulatory Visit (HOSPITAL_BASED_OUTPATIENT_CLINIC_OR_DEPARTMENT_OTHER)
Admission: RE | Admit: 2021-11-08 | Discharge: 2021-11-08 | Disposition: A | Discharge status: Home / Self Care | Payer: BC Managed Care – PPO | Source: Ambulatory Visit | Attending: Neurological Surgery | Admitting: Neurological Surgery

## 2021-11-08 DIAGNOSIS — G8929 Other chronic pain: Secondary | ICD-10-CM | POA: Insufficient documentation

## 2021-11-08 DIAGNOSIS — M542 Cervicalgia: Secondary | ICD-10-CM | POA: Insufficient documentation

## 2021-11-08 DIAGNOSIS — M25511 Pain in right shoulder: Secondary | ICD-10-CM | POA: Diagnosis not present

## 2021-11-19 ENCOUNTER — Other Ambulatory Visit: Payer: Self-pay

## 2021-11-19 MED ORDER — SEMAGLUTIDE-WEIGHT MANAGEMENT 0.5 MG/0.5ML ~~LOC~~ SOAJ: 0.5000 mg | SUBCUTANEOUS | 0 refills | Status: DC

## 2021-12-14 ENCOUNTER — Telehealth: Payer: Self-pay | Admitting: Internal Medicine

## 2021-12-16 ENCOUNTER — Encounter: Payer: Self-pay | Admitting: Internal Medicine

## 2021-12-16 DIAGNOSIS — R7303 Prediabetes: Secondary | ICD-10-CM | POA: Insufficient documentation

## 2021-12-16 MED ORDER — WEGOVY 1 MG/0.5ML ~~LOC~~ SOAJ: 1.0000 mg | SUBCUTANEOUS | 0 refills | Status: DC

## 2021-12-16 NOTE — Progress Notes (Unsigned)
    Subjective:    Patient ID: Carl Chimes, [name], male    DOB: [birth year], 41 y.o.   MRN: [id number]      HPI Carl Price is here for No chief complaint on file.    Anxiety -     Medications and allergies reviewed with patient and updated if appropriate.  Current Outpatient Medications on File Prior to Visit  Medication Sig Dispense Refill   cyclobenzaprine (FLEXERIL) 10 MG tablet Take 1 tablet (10 mg total) by mouth 2 (two) times daily as needed for muscle spasms. 30 tablet 0   meloxicam (MOBIC) 15 MG tablet Take 1 tablet (15 mg total) by mouth daily. 30 tablet 1   Semaglutide-Weight Management (WEGOVY) 1 MG/0.5ML SOAJ Inject 1 mg into the skin once a week. Follow-up appt is due in must see provider for future refills 2 mL 0   Semaglutide-Weight Management 0.5 MG/0.5ML SOAJ Inject 0.5 mg into the skin once a week for 28 days. 2 mL 0   sertraline (ZOLOFT) 100 MG tablet TAKE 1 AND 1/2 TABLETS(150 MG) BY MOUTH DAILY 135 tablet 3   No current facility-administered medications on file prior to visit.    Review of Systems     Objective:  There were no vitals filed for this visit. BP Readings from Last 3 Encounters:  09/12/21 124/84  02/05/21 114/82  11/22/19 122/82   Wt Readings from Last 3 Encounters:  02/05/21 242 lb (109.8 kg)  11/22/19 225 lb (102.1 kg)  11/09/18 226 lb 12.8 oz (102.9 kg)   There is no height or weight on file to calculate BMI.    Physical Exam         Assessment & Plan:    See Problem List for Assessment and Plan of chronic medical problems.

## 2021-12-17 ENCOUNTER — Ambulatory Visit (INDEPENDENT_AMBULATORY_CARE_PROVIDER_SITE_OTHER): Payer: BC Managed Care – PPO | Admitting: Internal Medicine

## 2021-12-17 VITALS — BP 118/76 | HR 80 | Temp 98.0°F | Ht 73.0 in | Wt 224.0 lb

## 2021-12-17 DIAGNOSIS — E669 Obesity, unspecified: Secondary | ICD-10-CM

## 2021-12-17 DIAGNOSIS — R7303 Prediabetes: Secondary | ICD-10-CM

## 2021-12-17 DIAGNOSIS — Z23 Encounter for immunization: Secondary | ICD-10-CM

## 2021-12-17 DIAGNOSIS — F419 Anxiety disorder, unspecified: Secondary | ICD-10-CM | POA: Diagnosis not present

## 2021-12-17 MED ORDER — SERTRALINE HCL 100 MG PO TABS
200.0000 mg | ORAL_TABLET | Freq: Every day | ORAL | 3 refills | Status: DC
Start: 2021-12-17 — End: 2022-07-01

## 2021-12-17 NOTE — Assessment & Plan Note (Addendum)
Chronic On wegovy but has had difficulty getting the wegovy at times Starting weight at 242 and today's weight 224 lb Has tolerated the medication well w/o side effects. Continue Wegovy 1 mg weekly-discussed that we can increase this as tolerated Encouraged regular exercise Advised healthy diet low in sugars and carbs-stressed the importance of lifestyle changes to help with weight loss and to help need to wean weight loss Follow-up in 6 months

## 2021-12-17 NOTE — Patient Instructions (Addendum)
     Flu vaccine given today   Medications changes include :   increase sertraline to 200 mg daily   Your prescription(s) have been sent to your pharmacy.     Return in about 6 months (around 06/17/2022) for Physical Exam.

## 2021-12-17 NOTE — Assessment & Plan Note (Addendum)
Chronic Having increased anger which I think is a manifestation of slightly uncontrolled anxiety Denies depression and has no symptoms consistent with bipolar or mania Discussed options-he has had difficulty coming off of the sertraline in the past and does not want to change medications Discussed possibly seeing a psychiatrist if he cannot get things controlled and he would like to hold off on that for now We will increase sertraline to 200 mg daily and see how this works Follow-up in 6 months, sooner if needed

## 2022-01-01 NOTE — Telephone Encounter (Signed)
Patient states that he did not get his wygovey at all in September - the pharmacy is telling you that patient can not get it filled until the 10th of October.  Doctor's office must call pharmacy - [facility] in [place] and let them know that it is ok to fill the September prescription - phone # [phone]

## 2022-01-02 ENCOUNTER — Encounter: Payer: Self-pay | Admitting: Internal Medicine

## 2022-01-06 NOTE — Telephone Encounter (Signed)
My-chart sent to patient on Friday.  I contacted pharmacy and script was ready for pick up.

## 2022-02-03 ENCOUNTER — Other Ambulatory Visit: Payer: Self-pay | Admitting: Internal Medicine

## 2022-02-04 MED ORDER — WEGOVY 1 MG/0.5ML ~~LOC~~ SOAJ: 1.0000 mg | SUBCUTANEOUS | 0 refills | Status: DC

## 2022-03-14 ENCOUNTER — Other Ambulatory Visit: Payer: Self-pay | Admitting: Internal Medicine

## 2022-03-17 ENCOUNTER — Other Ambulatory Visit: Payer: Self-pay | Admitting: Internal Medicine

## 2022-03-18 ENCOUNTER — Other Ambulatory Visit: Payer: Self-pay

## 2022-03-18 MED ORDER — WEGOVY 1 MG/0.5ML ~~LOC~~ SOAJ: 1.0000 mg | SUBCUTANEOUS | 0 refills | Status: DC

## 2022-04-18 ENCOUNTER — Other Ambulatory Visit: Payer: Self-pay | Admitting: Internal Medicine

## 2022-04-18 MED ORDER — WEGOVY 1 MG/0.5ML ~~LOC~~ SOAJ: 1.0000 mg | SUBCUTANEOUS | 0 refills | Status: DC

## 2022-04-18 NOTE — Telephone Encounter (Signed)
Can i get it increased to the next level please?

## 2022-06-30 ENCOUNTER — Encounter: Payer: Self-pay | Admitting: Internal Medicine

## 2022-06-30 NOTE — Progress Notes (Signed)
Subjective:    Patient ID: Carl Chimes, [name], male    DOB: 02-Nov-1980, 42 y.o.   MRN: [id number]     HPI Carl Price is here for a physical exam and his chronic medical problems.   No concerns.  Trying to work on weight    Medications and allergies reviewed with patient and updated if appropriate.  Current Outpatient Medications on File Prior to Visit  Medication Sig Dispense Refill   sertraline (ZOLOFT) 100 MG tablet Take 2 tablets (200 mg total) by mouth daily. 180 tablet 3   Semaglutide-Weight Management (WEGOVY) 1 MG/0.5ML SOAJ Inject 1 mg into the skin once a week. Follow-up appt is due in must see provider for future refills (Patient not taking: Reported on 07/01/2022) 2 mL 0   No current facility-administered medications on file prior to visit.    Review of Systems  Constitutional:  Negative for fever.  Eyes:  Negative for visual disturbance.  Respiratory:  Negative for cough, shortness of breath and wheezing.   Cardiovascular:  Negative for chest pain, palpitations and leg swelling.  Gastrointestinal:  Negative for abdominal pain, blood in stool, constipation and diarrhea.       Occ gerd  Genitourinary:  Negative for difficulty urinating, dysuria and hematuria.  Musculoskeletal:  Positive for neck pain (intermittent). Negative for arthralgias and back pain.  Skin:  Negative for rash.  Neurological:  Negative for light-headedness and headaches.  Psychiatric/Behavioral:  Negative for dysphoric mood. The patient is nervous/anxious (controlled).        Objective:   Vitals:   07/01/22 1301  BP: 120/76  Pulse: 76  Temp: 98.6 F (37 C)  SpO2: 98%   Filed Weights   07/01/22 1301  Weight: 228 lb (103.4 kg)   Body mass index is 30.08 kg/m.  BP Readings from Last 3 Encounters:  07/01/22 120/76  12/17/21 118/76  09/12/21 124/84    Wt Readings from Last 3 Encounters:  07/01/22 228 lb (103.4 kg)  12/17/21 224 lb (101.6 kg)  02/05/21 242 lb (109.8 kg)       Physical Exam Constitutional: He appears well-developed and well-nourished. No distress.  HENT:  Head: Normocephalic and atraumatic.  Right Ear: External ear normal.  Left Ear: External ear normal.  Normal ear canals and TM b/l  Mouth/Throat: Oropharynx is clear and moist. Eyes: Conjunctivae and EOM are normal.  Neck: Neck supple. No tracheal deviation present. No thyromegaly present.  No carotid bruit  Cardiovascular: Normal rate, regular rhythm, normal heart sounds and intact distal pulses.   No murmur heard.  No lower extremity edema. Pulmonary/Chest: Effort normal and breath sounds normal. No respiratory distress. He has no wheezes. He has no rales.  Abdominal: Soft. He exhibits no distension. There is no tenderness.  Genitourinary: deferred  Lymphadenopathy:   He has no cervical adenopathy.  Skin: Skin is warm and dry. He is not diaphoretic.  Psychiatric: He has a normal mood and affect. His behavior is normal.         Assessment & Plan:   Physical exam: Screening blood work  ordered Exercise   regular - increasing  Weight  has lost weight with wegovy.  No longer on it.   Working on weight loss.  Substance abuse   none   Reviewed recommended immunizations.   Health Maintenance  Topic Date Due   Hepatitis C Screening  Never done   COVID-19 Vaccine (2 - 2023-24 season) 07/17/2022 (Originally 11/29/2021)   INFLUENZA VACCINE  10/30/2022  DTaP/Tdap/Td (2 - Td or Tdap) 03/26/2026   HIV Screening  Completed   HPV VACCINES  Aged Out     See Problem List for Assessment and Plan of chronic medical problems.

## 2022-06-30 NOTE — Patient Instructions (Addendum)
Blood work was ordered.   The lab is on the first floor.    Medications changes include :   none     Return in about 6 months (around 12/31/2022) for follow up.   Health Maintenance, Male Adopting a healthy lifestyle and getting preventive care are important in promoting health and wellness. Ask your health care provider about: The right schedule for you to have regular tests and exams. Things you can do on your own to prevent diseases and keep yourself healthy. What should I know about diet, weight, and exercise? Eat a healthy diet  Eat a diet that includes plenty of vegetables, fruits, low-fat dairy products, and lean protein. Do not eat a lot of foods that are high in solid fats, added sugars, or sodium. Maintain a healthy weight Body mass index (BMI) is a measurement that can be used to identify possible weight problems. It estimates body fat based on height and weight. Your health care provider can help determine your BMI and help you achieve or maintain a healthy weight. Get regular exercise Get regular exercise. This is one of the most important things you can do for your health. Most adults should: Exercise for at least 150 minutes each week. The exercise should increase your heart rate and make you sweat (moderate-intensity exercise). Do strengthening exercises at least twice a week. This is in addition to the moderate-intensity exercise. Spend less time sitting. Even light physical activity can be beneficial. Watch cholesterol and blood lipids Have your blood tested for lipids and cholesterol at 40 years of age, then have this test every 5 years. You may need to have your cholesterol levels checked more often if: Your lipid or cholesterol levels are high. You are older than 40 years of age. You are at high risk for heart disease. What should I know about cancer screening? Many types of cancers can be detected early and may often be prevented. Depending on your  health history and family history, you may need to have cancer screening at various ages. This may include screening for: Colorectal cancer. Prostate cancer. Skin cancer. Lung cancer. What should I know about heart disease, diabetes, and high blood pressure? Blood pressure and heart disease High blood pressure causes heart disease and increases the risk of stroke. This is more likely to develop in people who have high blood pressure readings or are overweight. Talk with your health care provider about your target blood pressure readings. Have your blood pressure checked: Every 3-5 years if you are 48-53 years of age. Every year if you are 32 years old or older. If you are between the ages of 33 and 64 and are a current or former smoker, ask your health care provider if you should have a one-time screening for abdominal aortic aneurysm (AAA). Diabetes Have regular diabetes screenings. This checks your fasting blood sugar level. Have the screening done: Once every three years after age 68 if you are at a normal weight and have a low risk for diabetes. More often and at a younger age if you are overweight or have a high risk for diabetes. What should I know about preventing infection? Hepatitis B If you have a higher risk for hepatitis B, you should be screened for this virus. Talk with your health care provider to find out if you are at risk for hepatitis B infection. Hepatitis C Blood testing is recommended for: Everyone born from [birth year] through 1963. Anyone with known risk  factors for hepatitis C. Sexually transmitted infections (STIs) You should be screened each year for STIs, including gonorrhea and chlamydia, if: You are sexually active and are younger than 40 years of age. You are older than 40 years of age and your health care provider tells you that you are at risk for this type of infection. Your sexual activity has changed since you were last screened, and you are at increased risk  for chlamydia or gonorrhea. Ask your health care provider if you are at risk. Ask your health care provider about whether you are at high risk for HIV. Your health care provider may recommend a prescription medicine to help prevent HIV infection. If you choose to take medicine to prevent HIV, you should first get tested for HIV. You should then be tested every 3 months for as long as you are taking the medicine. Follow these instructions at home: Alcohol use Do not drink alcohol if your health care provider tells you not to drink. If you drink alcohol: Limit how much you have to 0-2 drinks a day. Know how much alcohol is in your drink. In the [place] one drink equals one 12 oz bottle of beer (355 mL), one 5 oz glass of wine (148 mL), or one 1 oz glass of hard liquor (44 mL). Lifestyle Do not use any products that contain nicotine or tobacco. These products include cigarettes, chewing tobacco, and vaping devices, such as e-cigarettes. If you need help quitting, ask your health care provider. Do not use street drugs. Do not share needles. Ask your health care provider for help if you need support or information about quitting drugs. General instructions Schedule regular health, dental, and eye exams. Stay current with your vaccines. Tell your health care provider if: You often feel depressed. You have ever been abused or do not feel safe at home. Summary Adopting a healthy lifestyle and getting preventive care are important in promoting health and wellness. Follow your health care provider's instructions about healthy diet, exercising, and getting tested or screened for diseases. Follow your health care provider's instructions on monitoring your cholesterol and blood pressure. This information is not intended to replace advice given to you by your health care provider. Make sure you discuss any questions you have with your health care provider. Document Revised: 08/06/2020 Document Reviewed:  08/06/2020 Elsevier Patient Education  [employer].

## 2022-07-01 ENCOUNTER — Ambulatory Visit (INDEPENDENT_AMBULATORY_CARE_PROVIDER_SITE_OTHER): Payer: BC Managed Care – PPO | Admitting: Internal Medicine

## 2022-07-01 ENCOUNTER — Encounter: Payer: Self-pay | Admitting: Internal Medicine

## 2022-07-01 VITALS — BP 120/76 | HR 76 | Temp 98.6°F | Ht 73.0 in | Wt 228.0 lb

## 2022-07-01 DIAGNOSIS — R7303 Prediabetes: Secondary | ICD-10-CM | POA: Diagnosis not present

## 2022-07-01 DIAGNOSIS — Z833 Family history of diabetes mellitus: Secondary | ICD-10-CM

## 2022-07-01 DIAGNOSIS — Z0000 Encounter for general adult medical examination without abnormal findings: Secondary | ICD-10-CM | POA: Diagnosis not present

## 2022-07-01 DIAGNOSIS — F419 Anxiety disorder, unspecified: Secondary | ICD-10-CM | POA: Diagnosis not present

## 2022-07-01 DIAGNOSIS — E782 Mixed hyperlipidemia: Secondary | ICD-10-CM

## 2022-07-01 DIAGNOSIS — M542 Cervicalgia: Secondary | ICD-10-CM | POA: Insufficient documentation

## 2022-07-01 DIAGNOSIS — E669 Obesity, unspecified: Secondary | ICD-10-CM

## 2022-07-01 DIAGNOSIS — Z1159 Encounter for screening for other viral diseases: Secondary | ICD-10-CM

## 2022-07-01 LAB — CBC WITH DIFFERENTIAL/PLATELET
Basophils Absolute: 0.1 10*3/uL (ref 0.0–0.1)
Basophils Relative: 0.9 % (ref 0.0–3.0)
Eosinophils Absolute: 0.3 10*3/uL (ref 0.0–0.7)
Eosinophils Relative: 3.1 % (ref 0.0–5.0)
HCT: 43.8 % (ref 39.0–52.0)
Hemoglobin: 15.2 g/dL (ref 13.0–17.0)
Lymphocytes Relative: 32.5 % (ref 12.0–46.0)
Lymphs Abs: 2.8 10*3/uL (ref 0.7–4.0)
MCHC: 34.6 g/dL (ref 30.0–36.0)
MCV: 83.3 fl (ref 78.0–100.0)
Monocytes Absolute: 0.6 10*3/uL (ref 0.1–1.0)
Monocytes Relative: 6.4 % (ref 3.0–12.0)
Neutro Abs: 5 10*3/uL (ref 1.4–7.7)
Neutrophils Relative %: 57.1 % (ref 43.0–77.0)
Platelets: 317 10*3/uL (ref 150.0–400.0)
RBC: 5.26 Mil/uL (ref 4.22–5.81)
RDW: 14 % (ref 11.5–15.5)
WBC: 8.7 10*3/uL (ref 4.0–10.5)

## 2022-07-01 LAB — COMPREHENSIVE METABOLIC PANEL
ALT: 31 U/L (ref 0–53)
AST: 24 U/L (ref 0–37)
Albumin: 4.2 g/dL (ref 3.5–5.2)
Alkaline Phosphatase: 74 U/L (ref 39–117)
BUN: 12 mg/dL (ref 6–23)
CO2: 27 mEq/L (ref 19–32)
Calcium: 9.1 mg/dL (ref 8.4–10.5)
Chloride: 102 mEq/L (ref 96–112)
Creatinine, Ser: 0.91 mg/dL (ref 0.40–1.50)
GFR: 105.79 mL/min (ref >60.00)
Glucose, Bld: 86 mg/dL (ref 70–99)
Potassium: 3.8 mEq/L (ref 3.5–5.1)
Sodium: 136 mEq/L (ref 135–145)
Total Bilirubin: 0.4 mg/dL (ref 0.2–1.2)
Total Protein: 6.9 g/dL (ref 6.0–8.3)

## 2022-07-01 LAB — LIPID PANEL
Cholesterol: 207 mg/dL — ABNORMAL HIGH (ref 0–200)
HDL: 35.3 mg/dL — ABNORMAL LOW (ref >39.00)
NonHDL: 171.85
Total CHOL/HDL Ratio: 6
Triglycerides: 262 mg/dL — ABNORMAL HIGH (ref 0.0–149.0)
VLDL: 52.4 mg/dL — ABNORMAL HIGH (ref 0.0–40.0)

## 2022-07-01 LAB — TSH: TSH: 1.64 u[IU]/mL (ref 0.35–5.50)

## 2022-07-01 LAB — LDL CHOLESTEROL, DIRECT: Direct LDL: 137 mg/dL

## 2022-07-01 LAB — HEMOGLOBIN A1C: Hgb A1c MFr Bld: 5.6 % (ref 4.6–6.5)

## 2022-07-01 MED ORDER — SERTRALINE HCL 100 MG PO TABS: 150.0000 mg | ORAL_TABLET | Freq: Every day | ORAL | 3 refills | Status: DC

## 2022-07-01 NOTE — Assessment & Plan Note (Addendum)
Chronic Controlled, Stable Continue sertraline 150 mg daily 

## 2022-07-01 NOTE — Assessment & Plan Note (Addendum)
Chronic Was on Wegovy, but no longer covered by his insurance Stressed healthy diet, decrease portions Stressed exercise  Discussed other options which he deferred - phentermine, qsymia, contrave

## 2022-07-01 NOTE — Assessment & Plan Note (Signed)
Chronic Check a1c Low sugar / carb diet Stressed regular exercise  

## 2022-07-01 NOTE — Assessment & Plan Note (Signed)
Chronic Regular exercise and healthy diet encouraged Check lipid panel, CMP, TSH Continue lifestyle control 

## 2022-07-02 LAB — HEPATITIS C ANTIBODY: Hepatitis C Ab: NONREACTIVE

## 2023-05-24 IMAGING — DX DG CERVICAL SPINE COMPLETE 4+V
5 series · 5 of 5 positions shown · non-contrast
Comparison: None

CLINICAL DATA: Right-sided neck pain with limited range of motion

EXAM:
CERVICAL SPINE - COMPLETE 4+ VIEW

[cervical spine lat]
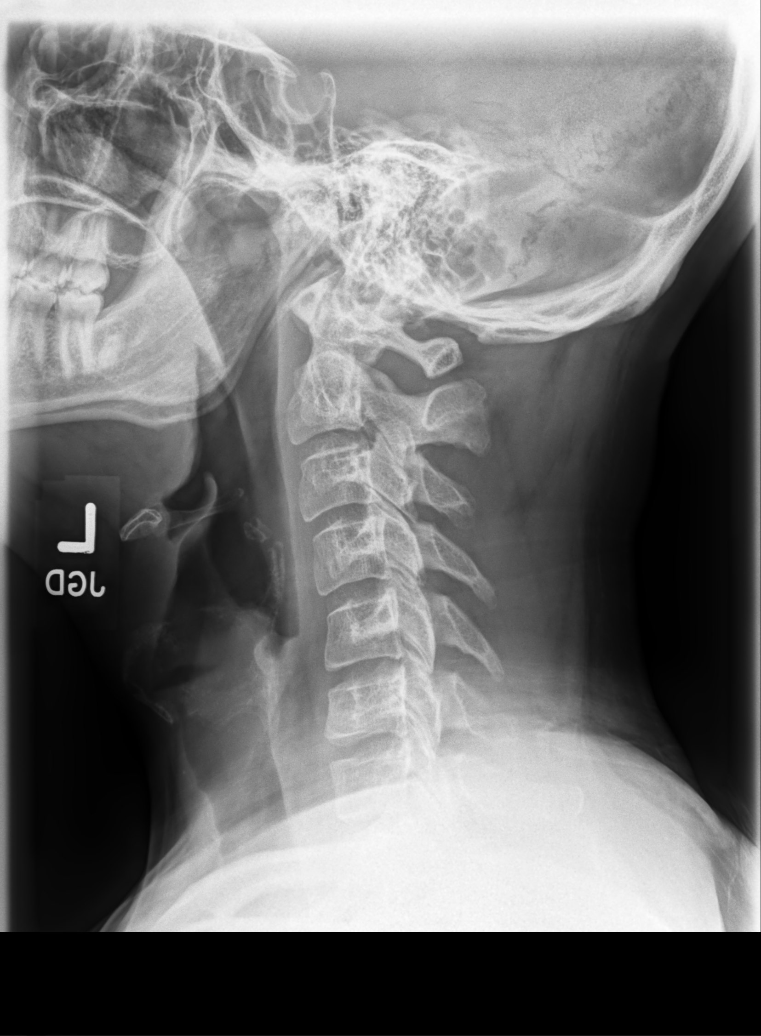

[cervical spine ap]
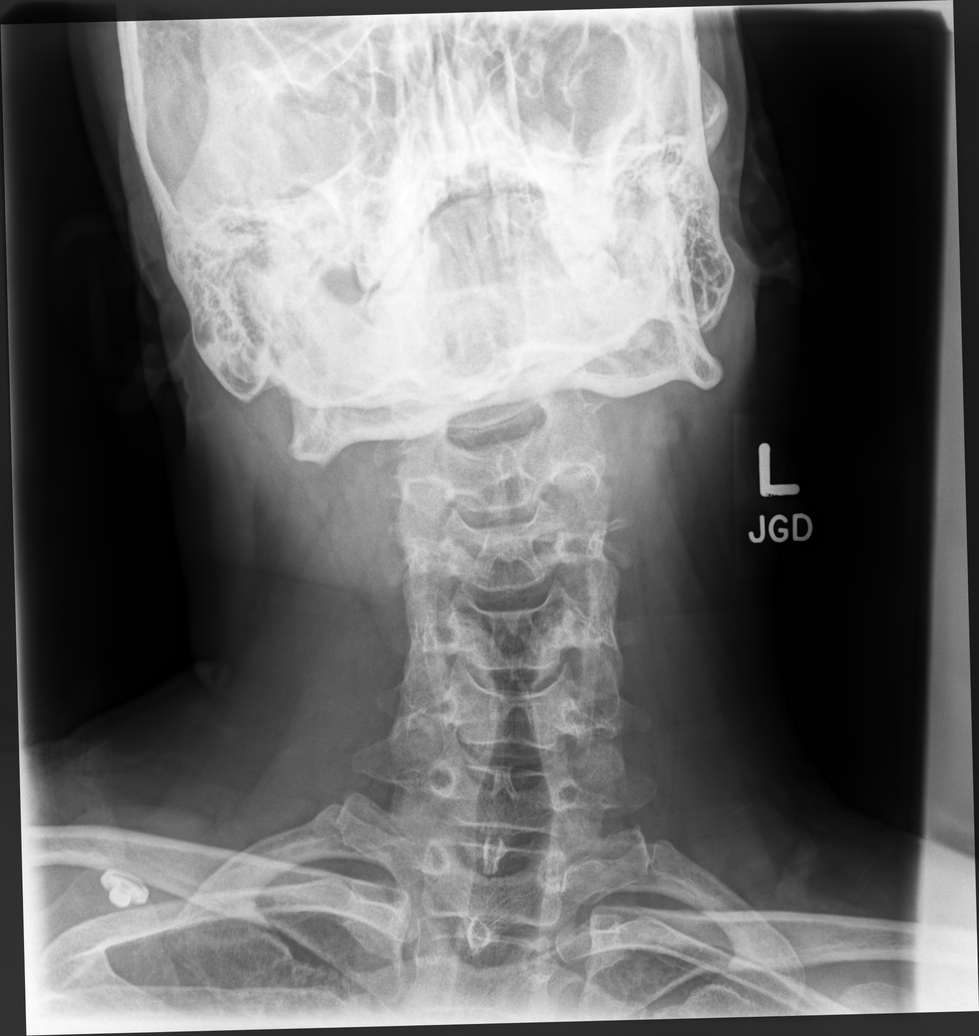

[cervical spine open mouth ap]
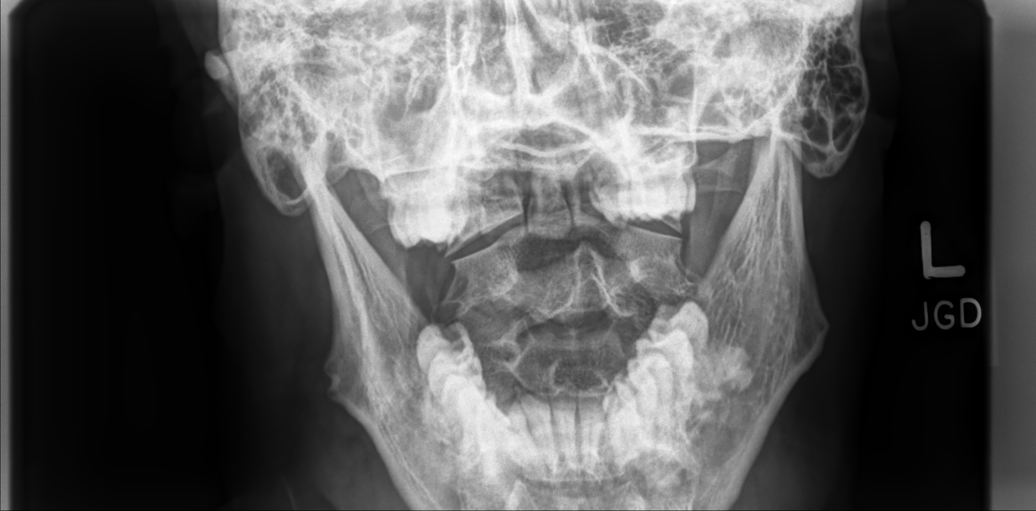

[cervical spine oblique (1 of 2)]
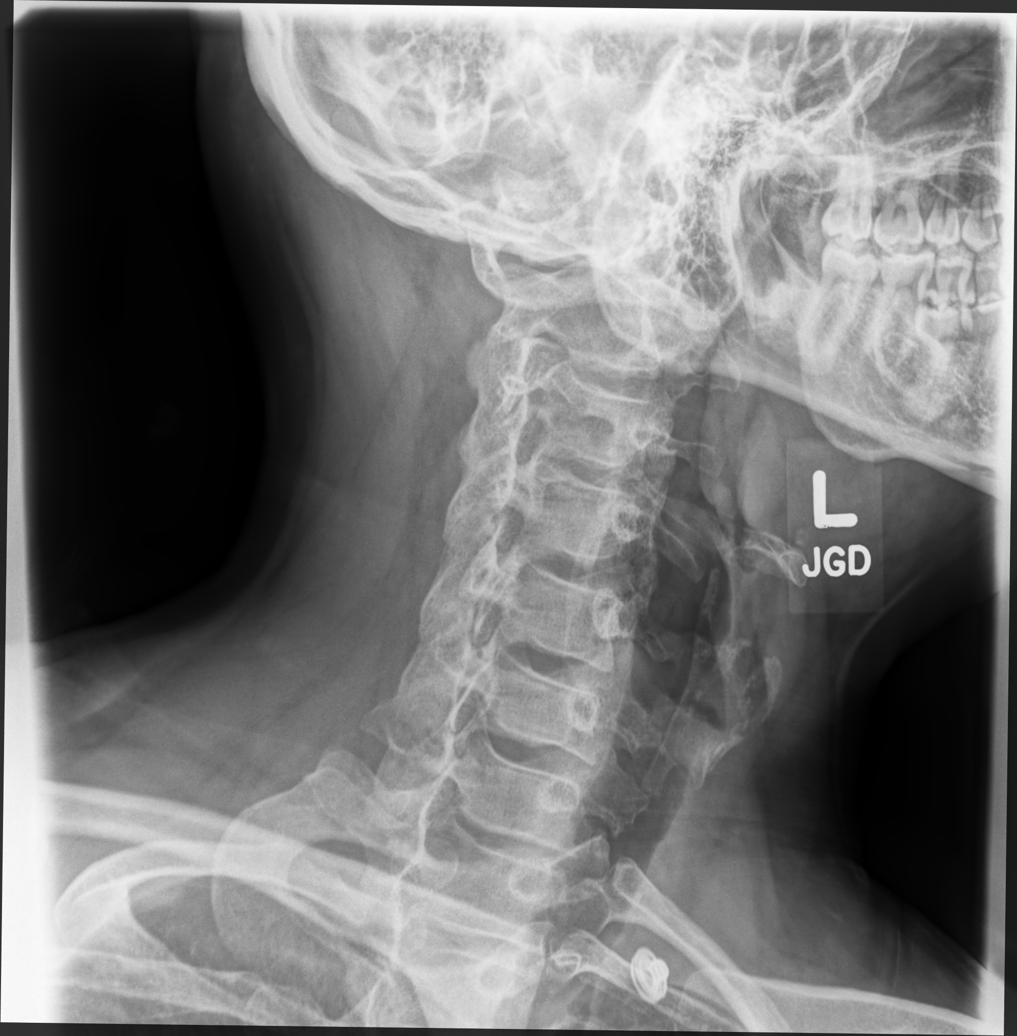

[cervical spine oblique (2 of 2)]
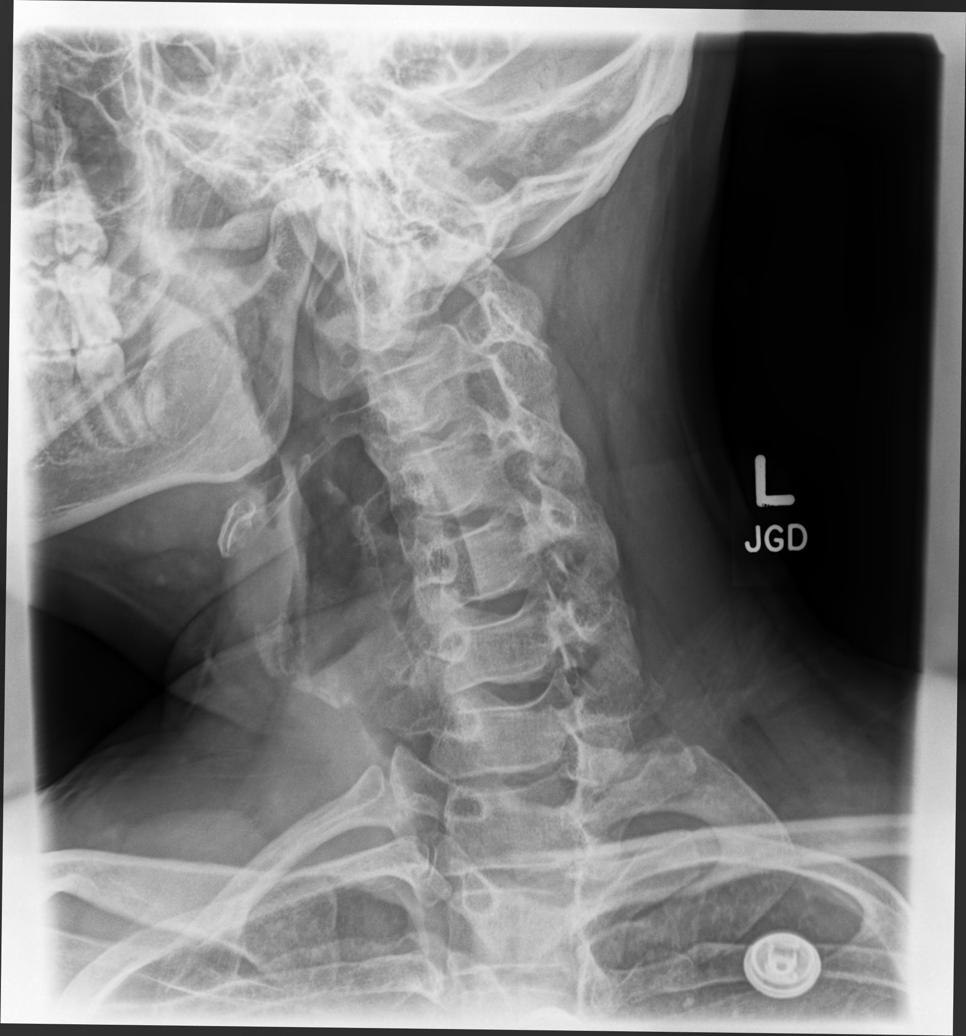

[5 of 5 positions shown; findings below may reference images not displayed]

FINDINGS: Curvature of the spine convex towards the left which could be
positional. Straightening of the normal cervical lordosis. No
visible osteophytic narrowing of the canal or foramina. No gross
facet arthritic changes by radiography. Disc space heights are
within normal limits.
IMPRESSION: Curvature of the spine convex to the left. Straightening of the
normal lordosis. No other focal finding.

## 2023-06-02 NOTE — Patient Instructions (Addendum)
      Blood work was ordered.       Medications changes include :   None    A referral was ordered and someone will call you to schedule an appointment.     Return in about 6 months (around 12/04/2023) for Physical Exam.

## 2023-06-02 NOTE — Progress Notes (Signed)
      Subjective:    Patient ID: Carl Griffith, [name], male    DOB: [birth year], 42 y.o.   MRN: [id number]     HPI Carl Price is here for follow up of his chronic medical problems.    Medications and allergies reviewed with patient and updated if appropriate.  Current Outpatient Medications on File Prior to Visit  Medication Sig Dispense Refill  . sertraline  (ZOLOFT ) 100 MG tablet Take 1.5 tablets (150 mg total) by mouth daily. 135 tablet 3   No current facility-administered medications on file prior to visit.     Review of Systems     Objective:  There were no vitals filed for this visit. BP Readings from Last 3 Encounters:  07/01/22 120/76  12/17/21 118/76  09/12/21 124/84   Wt Readings from Last 3 Encounters:  07/01/22 228 lb (103.4 kg)  12/17/21 224 lb (101.6 kg)  02/05/21 242 lb (109.8 kg)   There is no height or weight on file to calculate BMI.    Physical Exam     Lab Results  Component Value Date   WBC 8.7 07/01/2022   HGB 15.2 07/01/2022   HCT 43.8 07/01/2022   PLT 317.0 07/01/2022   GLUCOSE 86 07/01/2022   CHOL 207 (H) 07/01/2022   TRIG 262.0 (H) 07/01/2022   HDL 35.30 (L) 07/01/2022   LDLDIRECT 137.0 07/01/2022   LDLCALC 117 (H) 11/21/2019   ALT 31 07/01/2022   AST 24 07/01/2022   NA 136 07/01/2022   K 3.8 07/01/2022   CL 102 07/01/2022   CREATININE 0.91 07/01/2022   BUN 12 07/01/2022   CO2 27 07/01/2022   TSH 1.64 07/01/2022   HGBA1C 5.6 07/01/2022     Assessment & Plan:    See Problem List for Assessment and Plan of chronic medical problems.    This encounter was created in error - please disregard.

## 2023-06-03 ENCOUNTER — Encounter: Payer: 59 | Admitting: Internal Medicine

## 2023-06-03 DIAGNOSIS — E782 Mixed hyperlipidemia: Secondary | ICD-10-CM

## 2023-06-03 DIAGNOSIS — F419 Anxiety disorder, unspecified: Secondary | ICD-10-CM

## 2023-06-03 DIAGNOSIS — R7303 Prediabetes: Secondary | ICD-10-CM

## 2023-06-03 DIAGNOSIS — E66811 Obesity, class 1: Secondary | ICD-10-CM

## 2023-06-03 NOTE — Assessment & Plan Note (Signed)
Chronic Controlled, Stable Continue sertraline 150 mg daily 

## 2023-06-03 NOTE — Assessment & Plan Note (Signed)
 Chronic Was on Wegovy, but was not covered by his insurance - would like to try to get back on it - it was helpful for his weight Stressed healthy diet, decrease portions Stressed exercise  Discussed other options which he deferred - phentermine, qsymia, contrave

## 2023-06-03 NOTE — Assessment & Plan Note (Signed)
Chronic Regular exercise and healthy diet encouraged Check lipid panel, CMP, TSH Continue lifestyle control 

## 2023-06-03 NOTE — Assessment & Plan Note (Signed)
 Chronic Lab Results  Component Value Date   HGBA1C 5.6 07/01/2022   Check a1c Low sugar / carb diet Stressed regular exercise

## 2023-10-04 ENCOUNTER — Other Ambulatory Visit: Payer: Self-pay | Admitting: Internal Medicine

## 2023-11-21 ENCOUNTER — Other Ambulatory Visit: Payer: Self-pay | Admitting: Internal Medicine
